# Patient Record
Sex: Female | Born: 1945
Health system: Southern US, Community
[De-identification: ages and names within clinical notes are randomized; demographics above are authoritative.]

## PROBLEM LIST (undated history)

## (undated) DIAGNOSIS — J449 Chronic obstructive pulmonary disease, unspecified: Secondary | ICD-10-CM

## (undated) DIAGNOSIS — M1990 Unspecified osteoarthritis, unspecified site: Secondary | ICD-10-CM

## (undated) DIAGNOSIS — K219 Gastro-esophageal reflux disease without esophagitis: Secondary | ICD-10-CM

## (undated) DIAGNOSIS — R1310 Dysphagia, unspecified: Secondary | ICD-10-CM

## (undated) HISTORY — PX: RECTOPERITONEAL FISTULA CLOSURE: SHX2314

## (undated) HISTORY — PX: UPPER GASTROINTESTINAL ENDOSCOPY: SHX188

## (undated) HISTORY — DX: Dysphagia, unspecified: R13.10

---

## 1967-04-22 HISTORY — PX: RECONSTRUCTION MID-FACE: SUR1085

## 1998-08-13 ENCOUNTER — Other Ambulatory Visit: Admission: RE | Admit: 1998-08-13 | Discharge: 1998-08-13 | Payer: Self-pay | Admitting: Gynecology

## 1999-08-15 ENCOUNTER — Other Ambulatory Visit: Admission: RE | Admit: 1999-08-15 | Discharge: 1999-08-15 | Payer: Self-pay | Admitting: Gynecology

## 2000-08-18 ENCOUNTER — Other Ambulatory Visit: Admission: RE | Admit: 2000-08-18 | Discharge: 2000-08-18 | Payer: Self-pay | Admitting: Gynecology

## 2001-08-19 ENCOUNTER — Other Ambulatory Visit: Admission: RE | Admit: 2001-08-19 | Discharge: 2001-08-19 | Payer: Self-pay | Admitting: Gynecology

## 2002-09-01 ENCOUNTER — Other Ambulatory Visit: Admission: RE | Admit: 2002-09-01 | Discharge: 2002-09-01 | Payer: Self-pay | Admitting: Gynecology

## 2003-10-09 ENCOUNTER — Other Ambulatory Visit: Admission: RE | Admit: 2003-10-09 | Discharge: 2003-10-09 | Payer: Self-pay | Admitting: Gynecology

## 2003-10-30 ENCOUNTER — Ambulatory Visit (HOSPITAL_COMMUNITY): Admission: RE | Admit: 2003-10-30 | Discharge: 2003-10-30 | Payer: Self-pay | Admitting: Internal Medicine

## 2004-10-11 ENCOUNTER — Other Ambulatory Visit: Admission: RE | Admit: 2004-10-11 | Discharge: 2004-10-11 | Payer: Self-pay | Admitting: Gynecology

## 2005-05-20 ENCOUNTER — Encounter: Admission: RE | Admit: 2005-05-20 | Discharge: 2005-05-20 | Payer: Self-pay | Admitting: Neurosurgery

## 2005-06-03 ENCOUNTER — Encounter: Admission: RE | Admit: 2005-06-03 | Discharge: 2005-06-03 | Payer: Self-pay | Admitting: Neurosurgery

## 2005-06-18 ENCOUNTER — Encounter: Admission: RE | Admit: 2005-06-18 | Discharge: 2005-06-18 | Payer: Self-pay | Admitting: Neurosurgery

## 2010-05-12 ENCOUNTER — Encounter: Payer: Self-pay | Admitting: Internal Medicine

## 2011-03-27 ENCOUNTER — Ambulatory Visit (INDEPENDENT_AMBULATORY_CARE_PROVIDER_SITE_OTHER): Payer: Medicare Other | Admitting: Otolaryngology

## 2011-03-27 DIAGNOSIS — K121 Other forms of stomatitis: Secondary | ICD-10-CM

## 2011-05-11 ENCOUNTER — Encounter (HOSPITAL_COMMUNITY): Payer: Self-pay | Admitting: *Deleted

## 2011-05-11 ENCOUNTER — Emergency Department (HOSPITAL_COMMUNITY)
Admission: EM | Admit: 2011-05-11 | Discharge: 2011-05-11 | Disposition: A | Discharge status: Home / Self Care | Payer: Medicare Other | Attending: Emergency Medicine | Admitting: Emergency Medicine

## 2011-05-11 ENCOUNTER — Emergency Department (HOSPITAL_COMMUNITY): Payer: Medicare Other

## 2011-05-11 DIAGNOSIS — J40 Bronchitis, not specified as acute or chronic: Secondary | ICD-10-CM | POA: Insufficient documentation

## 2011-05-11 DIAGNOSIS — R059 Cough, unspecified: Secondary | ICD-10-CM | POA: Insufficient documentation

## 2011-05-11 DIAGNOSIS — F17200 Nicotine dependence, unspecified, uncomplicated: Secondary | ICD-10-CM | POA: Insufficient documentation

## 2011-05-11 DIAGNOSIS — J3489 Other specified disorders of nose and nasal sinuses: Secondary | ICD-10-CM | POA: Insufficient documentation

## 2011-05-11 DIAGNOSIS — Z7982 Long term (current) use of aspirin: Secondary | ICD-10-CM | POA: Insufficient documentation

## 2011-05-11 DIAGNOSIS — R05 Cough: Secondary | ICD-10-CM | POA: Insufficient documentation

## 2011-05-11 DIAGNOSIS — R109 Unspecified abdominal pain: Secondary | ICD-10-CM | POA: Insufficient documentation

## 2011-05-11 DIAGNOSIS — R042 Hemoptysis: Secondary | ICD-10-CM | POA: Insufficient documentation

## 2011-05-11 LAB — COMPREHENSIVE METABOLIC PANEL
ALT: 18 U/L (ref 0–35)
AST: 18 U/L (ref 0–37)
Alkaline Phosphatase: 83 U/L (ref 39–117)
Calcium: 10.3 mg/dL (ref 8.4–10.5)
Chloride: 105 mEq/L (ref 96–112)
Creatinine, Ser: 0.75 mg/dL (ref 0.50–1.10)
Potassium: 3.9 mEq/L (ref 3.5–5.1)
Sodium: 140 mEq/L (ref 135–145)
Total Bilirubin: 0.6 mg/dL (ref 0.3–1.2)

## 2011-05-11 LAB — CBC
MCH: 32.7 pg (ref 26.0–34.0)
MCHC: 35.2 g/dL (ref 30.0–36.0)
RBC: 4.31 MIL/uL (ref 3.87–5.11)
RDW: 12.2 % (ref 11.5–15.5)
WBC: 6.4 10*3/uL (ref 4.0–10.5)

## 2011-05-11 LAB — PROTIME-INR: Prothrombin Time: 12.7 seconds (ref 11.6–15.2)

## 2011-05-11 MED ORDER — MOXIFLOXACIN HCL 400 MG PO TABS
400.0000 mg | ORAL_TABLET | Freq: Every day | ORAL | Status: DC
  Administered 2011-05-11: 400 mg via ORAL
  Filled 2011-05-11: qty 1

## 2011-05-11 MED ORDER — ALBUTEROL SULFATE HFA 108 (90 BASE) MCG/ACT IN AERS
2.0000 | INHALATION_SPRAY | RESPIRATORY_TRACT | Status: DC | PRN
  Administered 2011-05-11: 2 via RESPIRATORY_TRACT
  Filled 2011-05-11: qty 6.7

## 2011-05-11 MED ORDER — MOXIFLOXACIN HCL 400 MG PO TABS: 400.0000 mg | ORAL_TABLET | Freq: Every day | ORAL | Status: AC

## 2011-05-11 NOTE — ED Provider Notes (Signed)
History     CSN: [id number]  Arrival date & time 05/11/11  0501   First MD Initiated Contact with Patient 05/11/11 [id number]      Chief Complaint  Patient presents with  . Hemoptysis  . Nasal Congestion  . Abdominal Pain    (Consider location/radiation/quality/duration/timing/severity/associated sxs/prior treatment) HPI Patient presents with his complaint of cough and coughing up blood. She states that she has had a cough productive of clear sputum for the past 3 weeks. She's also had nasal congestion. She states that last night she continued to have coughing but it was productive of blood. She estimates that the amount of blood was less than 1/2 cup. There's been no clots of blood. She has had no dizziness weakness or fainting. She has no history of easy bruising or bleeding. There's been no vomiting although she does have a history of reflux. She denies any difficulty breathing or chest pain. She had not had any treatment for the cough. She has not taken any medications for her symptoms prior to arrival. There no alleviating or modifying factors. There no associated systemic symptoms.  History reviewed. No pertinent past medical history.  History reviewed. No pertinent past surgical history.  History reviewed. No pertinent family history.  History  Substance Use Topics  . Smoking status: Current Everyday Smoker  . Smokeless tobacco: Not on file  . Alcohol Use: No    OB History    Grav Para Term Preterm Abortions TAB SAB Ect Mult Living                  Review of Systems ROS reviewed and otherwise negative except for mentioned in HPI  Allergies  Review of patient's allergies indicates no known allergies.  Home Medications   Current Outpatient Rx  Name Route Sig Dispense Refill  . ASPIRIN 81 MG PO TABS Oral Take 160 mg by mouth daily.    . OMEGA-3 FATTY ACIDS 1000 MG PO CAPS Oral Take 2 g by mouth daily.    . CENTRUM SILVER PO Oral Take by mouth.    Marland Kitchen MOXIFLOXACIN HCL  400 MG PO TABS Oral Take 1 tablet (400 mg total) by mouth daily. 10 tablet 0    BP 127/58  Pulse 74  Temp(Src) 98.6 F (37 C) (Oral)  Resp 20  Ht 5\' 4"  (1.626 m)  Wt 130 lb (58.968 kg)  BMI 22.31 kg/m2  SpO2 98% Vitals reviewed Physical Exam Physical Examination: General appearance - alert, well appearing, and in no distress Mental status - alert, oriented to person, place, and time Mouth - mucous membranes moist, pharynx normal without lesions Chest - clear to auscultation, occasional rhonchi clearing with cough, no wheezes, rales, symmetric air entry Heart - normal rate, regular rhythm, normal S1, S2, no murmurs, rubs, clicks or gallops Abdomen - soft, nontender, nondistended, no masses or organomegaly Musculoskeletal - no joint tenderness, deformity or swelling Extremities - peripheral pulses normal, no pedal edema, no clubbing or cyanosis Skin - normal coloration and turgor, no rashes  ED Course  Procedures (including critical care time)  Labs Reviewed  COMPREHENSIVE METABOLIC PANEL - Abnormal; Notable for the following:    Glucose, Bld 101 (*)    GFR calc non Af Amer 87 (*)    All other components within normal limits  CBC  PROTIME-INR  APTT   Dg Chest 2 View  05/11/2011  *RADIOLOGY REPORT*  Clinical Data: Cough.  CHEST - 2 VIEW  Comparison: 10/30/2003  Findings: There is  hyperinflation of the lungs compatible with COPD.  Heart and mediastinal contours are within normal limits.  No focal opacities or effusions.  No acute bony abnormality.  IMPRESSION: COPD.  No active disease.  Original Report Authenticated By: Cyndie Chime, M.D.   Ct Chest Wo Contrast  05/11/2011  *RADIOLOGY REPORT*  Clinical Data: Congestion, hemoptysis  CT CHEST WITHOUT CONTRAST  Technique:  Multidetector CT imaging of the chest was performed following the standard protocol without IV contrast.  Comparison: Chest radiographs dated 05/11/2011.  Findings: Moderate centrilobular emphysematous changes.   Biapical pleural parenchymal scarring.  No suspicious pulmonary nodules.  No pleural effusion or pneumothorax.  Visualized thyroid is mildly heterogeneous/nodular.  The heart is normal in size.  No pericardial effusion.  Coronary atherosclerosis.  Mild atherosclerotic calcifications of the aortic arch.  No suspicious mediastinal or axillary lymphadenopathy.  Visualized upper abdomen is notable for vascular calcifications and tiny hepatic cysts.  Degenerative changes of the visualized thoracolumbar spine.  IMPRESSION: No evidence of acute cardiopulmonary disease.  Moderate centrilobular emphysematous changes.  Original Report Authenticated By: Charline Bills, M.D.     1. Bronchitis       MDM  Patient with chronic cough over the past 3 weeks is presenting with coughing up blood beginning last night. Upon presentation to the ED she is nontoxic and well-appearing with stable vital signs. Her workup including labs chest x-ray and CT scan of her chest have no acute findings other than COPD. Patient is not aware of a history of COPD but does smoke cigarettes. There is no active bleeding or acute abnormality on CT scan. Patient will be started on albuterol and moxifloxacin for bronchitis. Results were discussed with her and she will arrange for close followup with her primary doctor at [facility]. She was given strict return precautions and is agreeable with this plan.        Ethelda Chick, MD 05/11/11 [phone]

## 2011-05-11 NOTE — ED Notes (Signed)
Pt return from xray.

## 2011-05-11 NOTE — ED Notes (Addendum)
States she also "eats a lot of TUMS" for indigestion.  Denies any black or tarry stools. States she had a bad sinus infection that began three weeks ago, has used Claritin, has been [place] acetominephen and one  ASA a day. Skin warm and dry.  Color wnl.  Denies nose bleed

## 2011-05-11 NOTE — ED Notes (Signed)
Coughs and then spits out bright red blood

## 2011-05-11 NOTE — ED Notes (Addendum)
Labs drawn when IV started.  Patient did also state she had experienced some weakness tonight.  Awaiting CT scan.

## 2011-05-11 NOTE — ED Notes (Signed)
Pt c/o congestion x 3wks. Pt states she started spitting up bright red blood since 9:30pm last night. Pt also c/o acid reflux.

## 2011-05-11 NOTE — ED Notes (Signed)
Continues to cough and spit out bright red blood

## 2011-05-12 ENCOUNTER — Other Ambulatory Visit (HOSPITAL_COMMUNITY): Payer: Self-pay | Admitting: Family Medicine

## 2011-05-12 DIAGNOSIS — Z139 Encounter for screening, unspecified: Secondary | ICD-10-CM

## 2011-05-15 ENCOUNTER — Ambulatory Visit (HOSPITAL_COMMUNITY)
Admission: RE | Admit: 2011-05-15 | Discharge: 2011-05-15 | Disposition: A | Discharge status: Home / Self Care | Payer: Medicare Other | Source: Ambulatory Visit | Attending: Family Medicine | Admitting: Family Medicine

## 2011-05-15 DIAGNOSIS — Z139 Encounter for screening, unspecified: Secondary | ICD-10-CM

## 2011-05-15 DIAGNOSIS — Z13820 Encounter for screening for osteoporosis: Secondary | ICD-10-CM | POA: Insufficient documentation

## 2011-05-15 DIAGNOSIS — M949 Disorder of cartilage, unspecified: Secondary | ICD-10-CM | POA: Insufficient documentation

## 2011-05-15 DIAGNOSIS — M899 Disorder of bone, unspecified: Secondary | ICD-10-CM | POA: Insufficient documentation

## 2011-05-15 DIAGNOSIS — Z780 Asymptomatic menopausal state: Secondary | ICD-10-CM | POA: Insufficient documentation

## 2011-05-29 ENCOUNTER — Ambulatory Visit (INDEPENDENT_AMBULATORY_CARE_PROVIDER_SITE_OTHER): Payer: Medicare Other | Admitting: Otolaryngology

## 2011-05-29 DIAGNOSIS — R070 Pain in throat: Secondary | ICD-10-CM

## 2011-05-29 DIAGNOSIS — K219 Gastro-esophageal reflux disease without esophagitis: Secondary | ICD-10-CM

## 2011-06-26 ENCOUNTER — Ambulatory Visit (INDEPENDENT_AMBULATORY_CARE_PROVIDER_SITE_OTHER): Payer: Medicare Other | Admitting: Otolaryngology

## 2011-06-26 DIAGNOSIS — J0100 Acute maxillary sinusitis, unspecified: Secondary | ICD-10-CM

## 2011-06-26 DIAGNOSIS — K219 Gastro-esophageal reflux disease without esophagitis: Secondary | ICD-10-CM

## 2011-06-26 DIAGNOSIS — R070 Pain in throat: Secondary | ICD-10-CM

## 2011-06-26 DIAGNOSIS — J0110 Acute frontal sinusitis, unspecified: Secondary | ICD-10-CM

## 2011-08-07 ENCOUNTER — Ambulatory Visit (INDEPENDENT_AMBULATORY_CARE_PROVIDER_SITE_OTHER): Payer: Medicare Other | Admitting: Otolaryngology

## 2011-08-07 DIAGNOSIS — J0100 Acute maxillary sinusitis, unspecified: Secondary | ICD-10-CM

## 2011-08-07 DIAGNOSIS — J330 Polyp of nasal cavity: Secondary | ICD-10-CM

## 2011-08-28 ENCOUNTER — Ambulatory Visit (INDEPENDENT_AMBULATORY_CARE_PROVIDER_SITE_OTHER): Payer: Medicare Other | Admitting: Otolaryngology

## 2011-09-24 ENCOUNTER — Encounter: Payer: Self-pay | Admitting: Gastroenterology

## 2011-10-02 ENCOUNTER — Encounter (HOSPITAL_COMMUNITY): Payer: Self-pay | Admitting: *Deleted

## 2011-10-02 ENCOUNTER — Other Ambulatory Visit (INDEPENDENT_AMBULATORY_CARE_PROVIDER_SITE_OTHER): Payer: Self-pay | Admitting: *Deleted

## 2011-10-02 ENCOUNTER — Encounter (HOSPITAL_COMMUNITY)
Admission: RE | Disposition: A | Discharge status: Home / Self Care | Payer: Self-pay | Source: Ambulatory Visit | Attending: Internal Medicine

## 2011-10-02 ENCOUNTER — Ambulatory Visit (HOSPITAL_COMMUNITY)
Admission: RE | Admit: 2011-10-02 | Discharge: 2011-10-02 | Disposition: A | Discharge status: Home / Self Care | Payer: Medicare Other | Source: Ambulatory Visit | Attending: Internal Medicine | Admitting: Internal Medicine

## 2011-10-02 DIAGNOSIS — K222 Esophageal obstruction: Secondary | ICD-10-CM | POA: Insufficient documentation

## 2011-10-02 DIAGNOSIS — K228 Other specified diseases of esophagus: Secondary | ICD-10-CM

## 2011-10-02 DIAGNOSIS — R1310 Dysphagia, unspecified: Secondary | ICD-10-CM | POA: Insufficient documentation

## 2011-10-02 DIAGNOSIS — K2289 Other specified disease of esophagus: Secondary | ICD-10-CM

## 2011-10-02 DIAGNOSIS — K449 Diaphragmatic hernia without obstruction or gangrene: Secondary | ICD-10-CM | POA: Insufficient documentation

## 2011-10-02 DIAGNOSIS — Z7982 Long term (current) use of aspirin: Secondary | ICD-10-CM | POA: Insufficient documentation

## 2011-10-02 HISTORY — DX: Unspecified osteoarthritis, unspecified site: M19.90

## 2011-10-02 HISTORY — DX: Gastro-esophageal reflux disease without esophagitis: K21.9

## 2011-10-02 SURGERY — ESOPHAGOGASTRODUODENOSCOPY (EGD) WITH ESOPHAGEAL DILATION
Anesthesia: Moderate Sedation

## 2011-10-02 MED ORDER — PANTOPRAZOLE SODIUM 40 MG PO TBEC: 40.0000 mg | DELAYED_RELEASE_TABLET | Freq: Every day | ORAL | Status: DC

## 2011-10-02 MED ORDER — SODIUM CHLORIDE 0.45 % IV SOLN
Freq: Once | INTRAVENOUS | Status: AC
  Administered 2011-10-02: 12:00:00 via INTRAVENOUS

## 2011-10-02 MED ORDER — BUTAMBEN-TETRACAINE-BENZOCAINE 2-2-14 % EX AERO
INHALATION_SPRAY | CUTANEOUS | Status: DC | PRN
  Administered 2011-10-02: 1 via TOPICAL

## 2011-10-02 MED ORDER — MEPERIDINE HCL 50 MG/ML IJ SOLN
INTRAMUSCULAR | Status: AC
  Filled 2011-10-02: qty 1

## 2011-10-02 MED ORDER — MEPERIDINE HCL 25 MG/ML IJ SOLN
INTRAMUSCULAR | Status: DC | PRN
  Administered 2011-10-02 (×2): 25 mg via INTRAVENOUS

## 2011-10-02 MED ORDER — MIDAZOLAM HCL 5 MG/5ML IJ SOLN
INTRAMUSCULAR | Status: DC | PRN
  Administered 2011-10-02 (×2): 2 mg via INTRAVENOUS
  Administered 2011-10-02 (×2): 1 mg via INTRAVENOUS

## 2011-10-02 MED ORDER — MIDAZOLAM HCL 5 MG/5ML IJ SOLN
INTRAMUSCULAR | Status: AC
  Filled 2011-10-02: qty 10
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
## 2011-10-02 NOTE — Discharge Instructions (Signed)
Resume usual medications. Pantoprazole 40 mg by mouth daily 30 minutes before breakfast. No driving for 24 hours. Physician will contact you with biopsy results

## 2011-10-02 NOTE — Op Note (Signed)
EGD PROCEDURE REPORT  PATIENT:  Abigail Evans  MR#:  [id number] Birthdate:  04-29-1945, 66 y.o., female Endoscopist:  Dr. Malissa Hippo, MD Referred By:  Dr. Madelin Rear. Sherwood Gambler, MD Procedure Date: 10/02/2011  Procedure:   EGD with ED.  Indications:  Patient is 66 year old Caucasian female who presents for solid food dysphagia. She feels she may have a food bolus stuck in her esophagus. She is undergoing diagnostic/therapeutic EGD. She does not take NSAIDs on regular basis and denies chronic heartburn.            Informed Consent:  The risks, benefits, alternatives & imponderables which include, but are not limited to, bleeding, infection, perforation, drug reaction and potential missed lesion have been reviewed.  The potential for biopsy, lesion removal, esophageal dilation, etc. have also been discussed.  Questions have been answered.  All parties agreeable.  Please see history & physical in medical record for more information.  Medications:  Demerol 50 mg IV Versed 6 mg IV Cetacaine spray topically for oropharyngeal anesthesia  Description of procedure:  The endoscope was introduced through the mouth and advanced to the second portion of the duodenum without difficulty or limitations. The mucosal surfaces were surveyed very carefully during advancement of the scope and upon withdrawal.  Findings:  Esophagus:  Esophageal mucosa at body revealed coarse appearance with linear furrows along with soft stricture at GE junction. GEJ:  37 cm Hiatus:  39 cm Stomach:  Stomach was empty and distended very well with insufflation. Folds in the proximal stomach were normal. Examination mucosa at body, antrum, pyloric channel, angularis, fundus and cardia was normal. Duodenum:  Normal bulbar and post bulbar  Therapeutic/Diagnostic Maneuvers Performed:  Stricture at GE junction was dilated with a balloon from 15-16.5 and 18 mm disrupting the mucosa. Esophageal biopsy was taken from body again for  eosinophilic esophagitis.  Complications:  None  Impression: No evidence of esophageal foreign body. Soft stricture at GE junction which was dilated with a balloon to 18 mm. Esophageal mucosal appearance suggestive of eosinophilic esophagitis. It was biopsied for histology. Small sliding hiatal hernia.  Recommendations:  Anti-reflux measures. Pantoprazole 40 mg by mouth every morning. I would be contacting patient with biopsy results and further recommendations.  Adrena Nakamura U  10/02/2011  1:16 PM  CC: Dr. Colette Ribas, MD & Dr. Bonnetta Barry ref. provider found

## 2011-10-09 ENCOUNTER — Encounter (INDEPENDENT_AMBULATORY_CARE_PROVIDER_SITE_OTHER): Payer: Self-pay | Admitting: *Deleted

## 2011-10-21 ENCOUNTER — Ambulatory Visit: Payer: Medicare Other | Admitting: Gastroenterology

## 2011-12-02 ENCOUNTER — Encounter (INDEPENDENT_AMBULATORY_CARE_PROVIDER_SITE_OTHER): Payer: Self-pay | Admitting: Internal Medicine

## 2011-12-02 ENCOUNTER — Ambulatory Visit (INDEPENDENT_AMBULATORY_CARE_PROVIDER_SITE_OTHER): Payer: Medicare Other | Admitting: Internal Medicine

## 2011-12-02 VITALS — BP 110/70 | HR 72 | Temp 97.6°F | Resp 20 | Ht 65.0 in | Wt 132.1 lb

## 2011-12-02 DIAGNOSIS — K222 Esophageal obstruction: Secondary | ICD-10-CM

## 2011-12-02 DIAGNOSIS — K219 Gastro-esophageal reflux disease without esophagitis: Secondary | ICD-10-CM

## 2011-12-02 NOTE — Patient Instructions (Addendum)
Call if you have swallowing difficulty. 

## 2011-12-02 NOTE — Progress Notes (Signed)
Presenting complaint;  Followup for GERD and dysphagia.  Subjective:  Patient is 66 year old Caucasian female who has chronic GERD who presented for emergency EGD 2 months ago. She possibly passed food bolus spontaneously. She was found to have soft stricture at GE junction which was dilated to 18 mm with a balloon. She's been maintained on pantoprazole. She feels 100% better. She says Prilosec did not work. She is not having any side effects with pantoprazole. She denies abdominal pain melena or rectal bleeding. She is interested in having colonoscopy for screening purposes later this year.  Current Medications: Current Outpatient Prescriptions  Medication Sig Dispense Refill  . aspirin 81 MG tablet Take 81 mg by mouth daily.       . Calcium Citrate-Vitamin D (CITRACAL/VITAMIN D PO) Take by mouth daily.      . pantoprazole (PROTONIX) 40 MG tablet Take 1 tablet (40 mg total) by mouth daily.  30 tablet  5     Objective: Blood pressure 110/70, pulse 72, temperature 97.6 F (36.4 C), temperature source Oral, resp. rate 20, height 5\' 5"  (1.651 m), weight 132 lb 1.6 oz (59.92 kg).  Conjunctiva is pink. Sclera is nonicteric Oropharyngeal mucosa is normal. No neck masses or thyromegaly noted. Cardiac exam with regular rhythm normal S1 and S2. No murmur or gallop noted. Lungs are clear to auscultation. Abdomen is soft and nontender without organomegaly or masses.  No LE edema or clubbing noted.  Labs/studies Results: Esophageal biopsy was negative for eosinophilic esophagitis.  Assessment:  Chronic GERD complicated by distal esophageal stricture. She is status post balloon dilation two months ago with complete resolution of her dysphagia. Esophageal biopsy was negative for by eosinophilic esophagitis.   Plan:  Continue anti-reflux measures and pantoprazole at current dose. Patient will call to schedule screening colonoscopy when she is ready. Office visit in one year.

## 2012-04-05 ENCOUNTER — Other Ambulatory Visit (HOSPITAL_COMMUNITY): Payer: Self-pay | Admitting: Internal Medicine

## 2012-04-05 DIAGNOSIS — Z0000 Encounter for general adult medical examination without abnormal findings: Secondary | ICD-10-CM

## 2012-04-08 ENCOUNTER — Ambulatory Visit (HOSPITAL_COMMUNITY): Payer: Medicare Other

## 2012-04-09 ENCOUNTER — Ambulatory Visit (HOSPITAL_COMMUNITY): Payer: Medicare Other

## 2012-04-15 ENCOUNTER — Ambulatory Visit (HOSPITAL_COMMUNITY)
Admission: RE | Admit: 2012-04-15 | Discharge: 2012-04-15 | Disposition: A | Discharge status: Home / Self Care | Payer: Medicare Other | Source: Ambulatory Visit | Attending: Internal Medicine | Admitting: Internal Medicine

## 2012-04-15 DIAGNOSIS — Z0000 Encounter for general adult medical examination without abnormal findings: Secondary | ICD-10-CM

## 2012-04-15 DIAGNOSIS — Z1231 Encounter for screening mammogram for malignant neoplasm of breast: Secondary | ICD-10-CM | POA: Insufficient documentation

## 2012-12-17 ENCOUNTER — Encounter (INDEPENDENT_AMBULATORY_CARE_PROVIDER_SITE_OTHER): Payer: Self-pay | Admitting: *Deleted

## 2013-02-08 ENCOUNTER — Encounter (INDEPENDENT_AMBULATORY_CARE_PROVIDER_SITE_OTHER): Payer: Self-pay | Admitting: Internal Medicine

## 2013-02-08 ENCOUNTER — Ambulatory Visit (INDEPENDENT_AMBULATORY_CARE_PROVIDER_SITE_OTHER): Payer: Medicare Other | Admitting: Internal Medicine

## 2013-02-08 VITALS — BP 124/68 | HR 74 | Temp 98.2°F | Resp 18 | Ht 65.0 in | Wt 130.6 lb

## 2013-02-08 DIAGNOSIS — K219 Gastro-esophageal reflux disease without esophagitis: Secondary | ICD-10-CM

## 2013-02-08 MED ORDER — PANTOPRAZOLE SODIUM 40 MG PO TBEC: 40.0000 mg | DELAYED_RELEASE_TABLET | Freq: Every day | ORAL | Status: DC

## 2013-02-08 NOTE — Progress Notes (Signed)
Presenting complaint;  Followup for chronic GERD.  Subjective:  Patient is a 67 year old Caucasian female who is here for yearly visit. She underwent EGD in June 2013 for dysphagia. She was found to have distal esophageal stricture which was dilated to 18 mm. Biopsy from esophagus was negative for eosinophilic esophagitis. She denies heartburn or swallowing difficulty. She complains of feeling of liquid in her throat and having to clear off and in this symptom is more pronounced at night. She has heartburn with certain foods. She may have some difficulty with bread and none with meat. She remains with good appetite. She denies abdominal pain melena or rectal bleeding. She is taking pantoprazole on as-needed basis only  Current Medications: Current Outpatient Prescriptions  Medication Sig Dispense Refill  . aspirin 81 MG tablet Take 81 mg by mouth daily.       . Calcium Citrate-Vitamin D (CITRACAL/VITAMIN D PO) Take by mouth daily.      . fish oil-omega-3 fatty acids 1000 MG capsule Take 1 g by mouth daily.      . pantoprazole (PROTONIX) 40 MG tablet Take 40 mg by mouth daily.      . pantoprazole (PROTONIX) 40 MG tablet Take 1 tablet (40 mg total) by mouth daily.  30 tablet  5   No current facility-administered medications for this visit.     Objective: Blood pressure 124/68, pulse 74, temperature 98.2 F (36.8 C), temperature source Oral, resp. rate 18, height 5\' 5"  (1.651 m), weight 130 lb 9.6 oz (59.24 kg). Conjunctiva is pink. Sclera is nonicteric Oropharyngeal mucosa is normal. No neck masses or thyromegaly noted. Cardiac exam with regular rhythm normal S1 and S2. No murmur or gallop noted. Lungs are clear to auscultation. Abdomen is flat and soft without tenderness organomegaly or masses. No LE edema or clubbing noted.    Assessment:  #1. Patient's throat symptoms appear to be secondary to GERD. She is taking PPI on when necessary basis which is not sufficient. She has  history of distal esophageal stricture and needs to be on PPI regularly.   Plan:  Patient advised to take pantoprazole 40 mg by mouth every morning. New prescription given for 90 days with 3 refills. She will call with progress report in one month and return for office visit in one year.

## 2013-02-08 NOTE — Patient Instructions (Signed)
Take pantoprazole 40 mg by mouth 30 minutes before breakfast daily. Please call office with progress report in one month.

## 2013-12-05 ENCOUNTER — Other Ambulatory Visit (HOSPITAL_COMMUNITY): Payer: Self-pay | Admitting: Gynecology

## 2013-12-05 DIAGNOSIS — Z139 Encounter for screening, unspecified: Secondary | ICD-10-CM

## 2013-12-08 ENCOUNTER — Ambulatory Visit (HOSPITAL_COMMUNITY)
Admission: RE | Admit: 2013-12-08 | Discharge: 2013-12-08 | Disposition: A | Discharge status: Home / Self Care | Payer: Medicare Other | Source: Ambulatory Visit | Attending: Gynecology | Admitting: Gynecology

## 2013-12-08 DIAGNOSIS — Z1231 Encounter for screening mammogram for malignant neoplasm of breast: Secondary | ICD-10-CM | POA: Insufficient documentation

## 2013-12-08 DIAGNOSIS — Z139 Encounter for screening, unspecified: Secondary | ICD-10-CM

## 2014-03-09 ENCOUNTER — Encounter (INDEPENDENT_AMBULATORY_CARE_PROVIDER_SITE_OTHER): Payer: Self-pay | Admitting: *Deleted

## 2014-04-11 ENCOUNTER — Encounter (INDEPENDENT_AMBULATORY_CARE_PROVIDER_SITE_OTHER): Payer: Self-pay | Admitting: Internal Medicine

## 2014-04-11 ENCOUNTER — Ambulatory Visit (INDEPENDENT_AMBULATORY_CARE_PROVIDER_SITE_OTHER): Payer: Medicare Other | Admitting: Internal Medicine

## 2014-04-11 VITALS — BP 116/52 | HR 64 | Temp 97.8°F | Ht 65.0 in | Wt 131.7 lb

## 2014-04-11 DIAGNOSIS — R1314 Dysphagia, pharyngoesophageal phase: Secondary | ICD-10-CM

## 2014-04-11 DIAGNOSIS — K219 Gastro-esophageal reflux disease without esophagitis: Secondary | ICD-10-CM
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
## 2014-04-11 NOTE — Patient Instructions (Signed)
OV in 1 year.  

## 2015-01-08 ENCOUNTER — Encounter (INDEPENDENT_AMBULATORY_CARE_PROVIDER_SITE_OTHER): Payer: Self-pay | Admitting: *Deleted

## 2015-01-31 ENCOUNTER — Other Ambulatory Visit (HOSPITAL_COMMUNITY): Payer: Self-pay | Admitting: Internal Medicine

## 2015-01-31 DIAGNOSIS — Z1231 Encounter for screening mammogram for malignant neoplasm of breast: Secondary | ICD-10-CM

## 2015-02-08 ENCOUNTER — Ambulatory Visit (HOSPITAL_COMMUNITY)
Admission: RE | Admit: 2015-02-08 | Discharge: 2015-02-08 | Disposition: A | Discharge status: Home / Self Care | Payer: Medicare Other | Source: Ambulatory Visit | Attending: Internal Medicine | Admitting: Internal Medicine

## 2015-02-08 DIAGNOSIS — Z1231 Encounter for screening mammogram for malignant neoplasm of breast: Secondary | ICD-10-CM | POA: Diagnosis not present

## 2015-02-09 ENCOUNTER — Other Ambulatory Visit (HOSPITAL_COMMUNITY): Payer: Self-pay | Admitting: Internal Medicine

## 2015-02-09 ENCOUNTER — Ambulatory Visit (HOSPITAL_COMMUNITY)
Admission: RE | Admit: 2015-02-09 | Discharge: 2015-02-09 | Disposition: A | Discharge status: Home / Self Care | Payer: Medicare Other | Source: Ambulatory Visit | Attending: Internal Medicine | Admitting: Internal Medicine

## 2015-02-09 DIAGNOSIS — R05 Cough: Secondary | ICD-10-CM

## 2015-02-09 DIAGNOSIS — J439 Emphysema, unspecified: Secondary | ICD-10-CM | POA: Diagnosis not present

## 2015-02-09 DIAGNOSIS — R059 Cough, unspecified: Secondary | ICD-10-CM

## 2015-02-09 DIAGNOSIS — J449 Chronic obstructive pulmonary disease, unspecified: Secondary | ICD-10-CM | POA: Diagnosis not present

## 2015-04-17 ENCOUNTER — Ambulatory Visit (INDEPENDENT_AMBULATORY_CARE_PROVIDER_SITE_OTHER): Payer: Medicare Other | Admitting: Internal Medicine

## 2016-08-17 ENCOUNTER — Emergency Department (HOSPITAL_COMMUNITY): Payer: PPO

## 2016-08-17 ENCOUNTER — Inpatient Hospital Stay (HOSPITAL_COMMUNITY)
Admission: EM | Admit: 2016-08-17 | Discharge: 2016-08-19 | DRG: 194 | Disposition: A | Discharge status: Home / Self Care | Payer: PPO | Attending: Internal Medicine | Admitting: Internal Medicine

## 2016-08-17 ENCOUNTER — Encounter (HOSPITAL_COMMUNITY): Payer: Self-pay | Admitting: Emergency Medicine

## 2016-08-17 DIAGNOSIS — K219 Gastro-esophageal reflux disease without esophagitis: Secondary | ICD-10-CM | POA: Diagnosis present

## 2016-08-17 DIAGNOSIS — Z801 Family history of malignant neoplasm of trachea, bronchus and lung: Secondary | ICD-10-CM

## 2016-08-17 DIAGNOSIS — Z8249 Family history of ischemic heart disease and other diseases of the circulatory system: Secondary | ICD-10-CM | POA: Diagnosis not present

## 2016-08-17 DIAGNOSIS — F17210 Nicotine dependence, cigarettes, uncomplicated: Secondary | ICD-10-CM | POA: Diagnosis present

## 2016-08-17 DIAGNOSIS — J449 Chronic obstructive pulmonary disease, unspecified: Secondary | ICD-10-CM | POA: Diagnosis not present

## 2016-08-17 DIAGNOSIS — R042 Hemoptysis: Secondary | ICD-10-CM | POA: Diagnosis not present

## 2016-08-17 DIAGNOSIS — J189 Pneumonia, unspecified organism: Secondary | ICD-10-CM

## 2016-08-17 DIAGNOSIS — Z7982 Long term (current) use of aspirin: Secondary | ICD-10-CM

## 2016-08-17 DIAGNOSIS — J440 Chronic obstructive pulmonary disease with acute lower respiratory infection: Secondary | ICD-10-CM | POA: Diagnosis present

## 2016-08-17 DIAGNOSIS — J181 Lobar pneumonia, unspecified organism: Secondary | ICD-10-CM

## 2016-08-17 LAB — BASIC METABOLIC PANEL
ANION GAP: 9 (ref 5–15)
BUN: 12 mg/dL (ref 6–20)
CALCIUM: 9.5 mg/dL (ref 8.9–10.3)
CO2: 24 mmol/L (ref 22–32)
Chloride: 105 mmol/L (ref 101–111)
Creatinine, Ser: 0.95 mg/dL (ref 0.44–1.00)
GFR, EST NON AFRICAN AMERICAN: 59 mL/min — AB (ref >60)
Glucose, Bld: 110 mg/dL — ABNORMAL HIGH (ref 65–99)
Potassium: 3.7 mmol/L (ref 3.5–5.1)
SODIUM: 138 mmol/L (ref 135–145)

## 2016-08-17 LAB — TYPE AND SCREEN
ABO/RH(D): O POS
ANTIBODY SCREEN: NEGATIVE

## 2016-08-17 LAB — CBC WITH DIFFERENTIAL/PLATELET
BASOS ABS: 0 10*3/uL (ref 0.0–0.1)
BASOS PCT: 0 %
EOS ABS: 0.2 10*3/uL (ref 0.0–0.7)
Eosinophils Relative: 2 %
HCT: 39.3 % (ref 36.0–46.0)
Hemoglobin: 13.7 g/dL (ref 12.0–15.0)
Lymphocytes Relative: 31 %
Lymphs Abs: 2 10*3/uL (ref 0.7–4.0)
MCH: 32.8 pg (ref 26.0–34.0)
MCHC: 34.9 g/dL (ref 30.0–36.0)
MCV: 94 fL (ref 78.0–100.0)
MONO ABS: 0.4 10*3/uL (ref 0.1–1.0)
MONOS PCT: 7 %
NEUTROS ABS: 3.9 10*3/uL (ref 1.7–7.7)
NEUTROS PCT: 60 %
PLATELETS: 237 10*3/uL (ref 150–400)
RBC: 4.18 MIL/uL (ref 3.87–5.11)
RDW: 12.7 % (ref 11.5–15.5)
WBC: 6.5 10*3/uL (ref 4.0–10.5)

## 2016-08-17 LAB — PROTIME-INR
INR: 0.93
Prothrombin Time: 12.4 seconds (ref 11.4–15.2)

## 2016-08-17 LAB — POC OCCULT BLOOD, ED: FECAL OCCULT BLD: NEGATIVE

## 2016-08-17 LAB — TROPONIN I: Troponin I: <0.03 ng/mL (ref <0.03)

## 2016-08-17 LAB — I-STAT CG4 LACTIC ACID, ED: Lactic Acid, Venous: 1.85 mmol/L (ref 0.5–1.9)

## 2016-08-17 LAB — D-DIMER, QUANTITATIVE (NOT AT ARMC): D DIMER QUANT: 0.32 ug{FEU}/mL (ref 0.00–0.50)

## 2016-08-17 MED ORDER — PANTOPRAZOLE SODIUM 40 MG PO TBEC
40.0000 mg | DELAYED_RELEASE_TABLET | Freq: Every day | ORAL | Status: DC
  Administered 2016-08-17 – 2016-08-19 (×3): 40 mg via ORAL
  Filled 2016-08-17 (×3): qty 1

## 2016-08-17 MED ORDER — SODIUM CHLORIDE 0.9 % IV SOLN
INTRAVENOUS | Status: DC
  Administered 2016-08-17 – 2016-08-19 (×4): via INTRAVENOUS

## 2016-08-17 MED ORDER — LEVOFLOXACIN IN D5W 750 MG/150ML IV SOLN
750.0000 mg | Freq: Once | INTRAVENOUS | Status: AC
  Administered 2016-08-17: 750 mg via INTRAVENOUS
  Filled 2016-08-17: qty 150

## 2016-08-17 MED ORDER — IOPAMIDOL (ISOVUE-370) INJECTION 76%
100.0000 mL | Freq: Once | INTRAVENOUS | Status: AC | PRN
  Administered 2016-08-17: 100 mL via INTRAVENOUS

## 2016-08-17 MED ORDER — LEVOFLOXACIN IN D5W 750 MG/150ML IV SOLN
750.0000 mg | INTRAVENOUS | Status: DC
  Administered 2016-08-18 – 2016-08-19 (×2): 750 mg via INTRAVENOUS
  Filled 2016-08-17 (×2): qty 150

## 2016-08-17 MED ORDER — SODIUM CHLORIDE 0.9 % IV SOLN
INTRAVENOUS | Status: AC
  Administered 2016-08-17: 08:00:00 via INTRAVENOUS
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
## 2016-08-17 NOTE — ED Notes (Signed)
After pt ambulated to the restroom, pt began spitting up blood again. EDP aware and will be admitting the pt.

## 2016-08-17 NOTE — ED Triage Notes (Signed)
Pt C/O blood in her vomit since Friday night at 2200. Pt C/O "pain all the way around in my ribs."

## 2016-08-18 DIAGNOSIS — Z7982 Long term (current) use of aspirin: Secondary | ICD-10-CM | POA: Diagnosis not present

## 2016-08-18 DIAGNOSIS — Z8249 Family history of ischemic heart disease and other diseases of the circulatory system: Secondary | ICD-10-CM | POA: Diagnosis not present

## 2016-08-18 DIAGNOSIS — F17210 Nicotine dependence, cigarettes, uncomplicated: Secondary | ICD-10-CM | POA: Diagnosis not present

## 2016-08-18 DIAGNOSIS — K219 Gastro-esophageal reflux disease without esophagitis: Secondary | ICD-10-CM | POA: Diagnosis not present

## 2016-08-18 DIAGNOSIS — Z801 Family history of malignant neoplasm of trachea, bronchus and lung: Secondary | ICD-10-CM | POA: Diagnosis not present

## 2016-08-18 DIAGNOSIS — J440 Chronic obstructive pulmonary disease with acute lower respiratory infection: Secondary | ICD-10-CM | POA: Diagnosis not present

## 2016-08-18 DIAGNOSIS — J181 Lobar pneumonia, unspecified organism: Secondary | ICD-10-CM | POA: Diagnosis not present

## 2016-08-18 DIAGNOSIS — J189 Pneumonia, unspecified organism: Secondary | ICD-10-CM | POA: Diagnosis not present

## 2016-08-18 DIAGNOSIS — R042 Hemoptysis: Secondary | ICD-10-CM | POA: Diagnosis not present

## 2016-08-18 LAB — BASIC METABOLIC PANEL
Anion gap: 4 — ABNORMAL LOW (ref 5–15)
BUN: 8 mg/dL (ref 6–20)
CO2: 23 mmol/L (ref 22–32)
CREATININE: 0.8 mg/dL (ref 0.44–1.00)
Calcium: 8.3 mg/dL — ABNORMAL LOW (ref 8.9–10.3)
Chloride: 110 mmol/L (ref 101–111)
GFR calc Af Amer: >60 mL/min (ref >60)
GLUCOSE: 88 mg/dL (ref 65–99)
Potassium: 4.1 mmol/L (ref 3.5–5.1)
Sodium: 137 mmol/L (ref 135–145)

## 2016-08-18 LAB — CBC
HEMATOCRIT: 32.9 % — AB (ref 36.0–46.0)
Hemoglobin: 11.3 g/dL — ABNORMAL LOW (ref 12.0–15.0)
MCH: 32.9 pg (ref 26.0–34.0)
MCHC: 34.3 g/dL (ref 30.0–36.0)
MCV: 95.9 fL (ref 78.0–100.0)
PLATELETS: 194 10*3/uL (ref 150–400)
RBC: 3.43 MIL/uL — ABNORMAL LOW (ref 3.87–5.11)
RDW: 12.8 % (ref 11.5–15.5)
WBC: 4.6 10*3/uL (ref 4.0–10.5)

## 2016-08-18 LAB — STREP PNEUMONIAE URINARY ANTIGEN: Strep Pneumo Urinary Antigen: NEGATIVE

## 2016-08-18 LAB — HIV ANTIBODY (ROUTINE TESTING W REFLEX): HIV Screen 4th Generation wRfx: NONREACTIVE

## 2016-08-18 MED ORDER — ALBUTEROL SULFATE (2.5 MG/3ML) 0.083% IN NEBU: 2.5000 mg | INHALATION_SOLUTION | RESPIRATORY_TRACT | Status: DC | PRN

## 2016-08-18 NOTE — Progress Notes (Signed)
PROGRESS NOTE                                                                                                                                                                                                             Patient Demographics:    Abigail Evans, is a 71 y.o. female, DOB - [birth year], ZOX:[id number]  Admit date - 08/17/2016   Admitting Physician Starleen Arms, MD  Outpatient Primary MD for the patient is Cassell Smiles, MD  LOS - 0  Chief Complaint  Patient presents with  . Hemoptysis       Brief Narrative   71 y.o. female, With significant past medical history of GERD, presents with complaints of hemoptysis, CTA chest negative for PE, but significant for pneumonia and severe COPD.   Subjective:    Abigail Evans today has, No headache, No chest pain, No abdominal pain - No Nausea, No weakness, still reports cough and hemoptysis, but it is less productive today.  Assessment  & Plan :    Active Problems:   Hemoptysis   Pneumonia   Hemoptysis secondary to pneumonia - It is a significant today per patient, but has not resolved yet, as well she had drop in her hemoglobin 13.7-11.3(some delusional factor from IV fluid), so she will stay for another day to keep close monitoring on her hemoptysis.  - CTA chest negative for PE  Pneumonia - Continue treatment with levofloxacin for community-acquired pneumonia, blood cultures with no growth to date, follow sputum cultures.  History of GERD - Continue with PPI  Tobacco abuse - She was counseled again today, went to length about her significant COPD, the patient determined to stop, continue with nicotine patch  COPD - CT chest with significant severe centrilobular emphysema, no active wheezing, on when necessary nebs.  Code Status : Full  Family Communication  : D/W family at bedside  Disposition Plan  : Home when stable  Consults  :  None  Procedures  : None  DVT  Prophylaxis  : SCDs   Lab Results  Component Value Date   PLT 194 08/18/2016    Antibiotics  :    Anti-infectives    Start     Dose/Rate Route Frequency Ordered Stop   08/18/16 0600  levofloxacin (LEVAQUIN) IVPB 750 mg     750 mg 100  mL/hr over 90 Minutes Intravenous Every 24 hours 08/17/16 0834 08/23/16 0559   08/17/16 0600  levofloxacin (LEVAQUIN) IVPB 750 mg     750 mg 100 mL/hr over 90 Minutes Intravenous  Once 08/17/16 0550 08/17/16 0802        Objective:   Vitals:   08/17/16 0950 08/17/16 1300 08/17/16 2125 08/18/16 0521  BP: (!) 102/54 114/61 (!) 148/55 (!) 133/47  Pulse: 81 71 94 90  Resp: Temp: 98.3 F (36.8 C) 98 F (36.7 C) 98.6 F (37 C) 98.4 F (36.9 C)  TempSrc: Oral Oral Oral Oral  SpO2: 96% 95% 97% 96%  Weight: 61.8 kg (136 lb 3.2 oz)     Height:  (1.651 m)       Wt Readings from Last 3 Encounters:  08/17/16 61.8 kg (136 lb 3.2 oz)  04/11/14 59.7 kg (131 lb 11.2 oz)  02/08/13 59.2 kg (130 lb 9.6 oz)     Intake/Output Summary (Last 24 hours) at 08/18/16 0954 Last data filed at 08/18/16 0615  Gross per 24 hour  Intake          2116.25 ml  Output                0 ml  Net          2116.25 ml     Physical Exam  Awake Alert, Oriented X 3, No new F.N deficits, Normal affect Supple Neck,No JVD Symmetrical Chest wall movement, Good air movement bilaterally, Scattered rails and upper lungs RRR,No Gallops,Rubs or new Murmurs, No Parasternal Heave +ve B.Sounds, Abd Soft, No tenderness, No rebound - guarding or rigidity. No Cyanosis, Clubbing or edema, No new Rash or bruise      Data Review:    CBC  Recent Labs Lab 08/17/16 0420 08/18/16 0605  WBC 6.5 4.6  HGB 13.7 11.3*  HCT 39.3 32.9*  PLT 237 194  MCV 94.0 95.9  MCH 32.8 32.9  MCHC 34.9 34.3  RDW 12.7 12.8  LYMPHSABS 2.0  --   MONOABS 0.4  --   EOSABS 0.2  --   BASOSABS 0.0  --     Chemistries   Recent Labs Lab 08/17/16 0420 08/18/16 0605  NA 138 137    K 3.7 4.1  CL 105 110  CO2 24 23  GLUCOSE 110* 88  BUN 12 8  CREATININE 0.95 0.80  CALCIUM 9.5 8.3*   ------------------------------------------------------------------------------------------------------------------ No results for input(s): CHOL, HDL, LDLCALC, TRIG, CHOLHDL, LDLDIRECT in the last 72 hours.  No results found for: HGBA1C ------------------------------------------------------------------------------------------------------------------ No results for input(s): TSH, T4TOTAL, T3FREE, THYROIDAB in the last 72 hours.  Invalid input(s): FREET3 ------------------------------------------------------------------------------------------------------------------ No results for input(s): VITAMINB12, FOLATE, FERRITIN, TIBC, IRON, RETICCTPCT in the last 72 hours.  Coagulation profile  Recent Labs Lab 08/17/16 0420  INR 0.93     Recent Labs  08/17/16 0420  DDIMER 0.32    Cardiac Enzymes  Recent Labs Lab 08/17/16 0420  TROPONINI <0.03   ------------------------------------------------------------------------------------------------------------------ No results found for: BNP  Inpatient Medications  Scheduled Meds: . pantoprazole  40 mg Oral Abigail   Continuous Infusions: . sodium chloride 75 mL/hr at 08/17/16 2156  . levofloxacin (LEVAQUIN) IV Stopped (08/18/16 0745)   PRN Meds:.  Micro Results Recent Results (from the past 240 hour(s))  Blood culture (routine x 2)     Status: None (Preliminary result)   Collection Time: 08/17/16  6:09 AM  Result Value Ref Range Status  Specimen Description BLOOD LEFT ARM  Final   Special Requests   Final    BOTTLES DRAWN AEROBIC AND ANAEROBIC Blood Culture adequate volume   Culture NO GROWTH 1 DAY  Final   Report Status PENDING  Incomplete  Blood culture (routine x 2)     Status: None (Preliminary result)   Collection Time: 08/17/16  6:16 AM  Result Value Ref Range Status   Specimen Description BLOOD LEFT HAND   Final   Special Requests   Final    BOTTLES DRAWN AEROBIC AND ANAEROBIC Blood Culture adequate volume   Culture NO GROWTH 1 DAY  Final   Report Status PENDING  Incomplete    Radiology Reports Dg Chest 2 View  Result Date: 08/17/2016 CLINICAL DATA:  Hematemesis. EXAM: CHEST  2 VIEW COMPARISON:  Chest CT August 17, 2016 at 0505 hours FINDINGS: The heart size and mediastinal contours are within normal limits. Mildly calcified aortic knob. Both lungs are clear. Increased lung volumes, apical bullous changes and mild chronic interstitial changes. The visualized skeletal structures are nonacute; osteopenia and. IMPRESSION: COPD. Electronically Signed   By: Awilda Metro M.D.   On: 08/17/2016 05:29   Ct Angio Chest Pe W And/or Wo Contrast  Result Date: 08/17/2016 CLINICAL DATA:  71 year old female with hemoptysis. EXAM: CT ANGIOGRAPHY CHEST WITH CONTRAST TECHNIQUE: Multidetector CT imaging of the chest was performed using the standard protocol during bolus administration of intravenous contrast. Multiplanar CT image reconstructions and MIPs were obtained to evaluate the vascular anatomy. CONTRAST:  100 cc Isovue 370 COMPARISON:  Chest radiograph dated 02/09/2015 and CT dated 05/11/2011 FINDINGS: Cardiovascular: There is no cardiomegaly or pericardial effusion. Mild calcified and noncalcified atherosclerotic plaque along the thoracic aorta. No aneurysmal dilatation or evidence of dissection. The origins of the great vessels of the aortic arch appear patent. There is no CT evidence of pulmonary embolism. Mediastinum/Nodes: No hilar or mediastinal adenopathy. Multiple small mediastinal lymph nodes in the aortopulmonic window. The esophagus is grossly unremarkable. Lungs/Pleura: Severe centrilobular emphysema. There is mild bronchiectatic changes primarily involving the lower lobes. Patchy area of ground-glass density at the left lung base and lingula most consistent with pneumonia. Clinical correlation and  follow-up to resolution recommended. Faint area of density in the right middle lobe may represent atelectasis versus developing infiltrate. There are linear scarring of the left lung base. No pleural effusion or pneumothorax. The central airways are patent. Upper Abdomen: Small scattered hypodense lesions of the liver ulna well characterized but appears similar to the prior CT most likely representing cysts or hemangioma. The visualized upper abdomen is otherwise unremarkable. Musculoskeletal: Degenerative changes of the spine. No acute osseous pathology. Review of the MIP images confirms the above findings. IMPRESSION: 1. No CT evidence of pulmonary embolism. 2. Patchy areas of hazy density primarily involving the left lower lobe and lingula most consistent with developing infiltrate. Clinical correlation and follow-up resolution recommended. 3. Severe centrilobular emphysema.  No pneumothorax. Electronically Signed   By: Elgie Collard M.D.   On: 08/17/2016 05:45     ELGERGAWY, DAWOOD M.D on 08/18/2016 at 9:54 AM  Between 7am to 7pm - Pager - [phone]  After 7pm go to [email or web address] - password [email or web address]  [facility] -  Office  [phone]

## 2016-08-19 LAB — CBC
HEMATOCRIT: 31 % — AB (ref 36.0–46.0)
Hemoglobin: 10.6 g/dL — ABNORMAL LOW (ref 12.0–15.0)
MCH: 32.8 pg (ref 26.0–34.0)
MCHC: 34.2 g/dL (ref 30.0–36.0)
MCV: 96 fL (ref 78.0–100.0)
Platelets: 177 10*3/uL (ref 150–400)
RBC: 3.23 MIL/uL — AB (ref 3.87–5.11)
RDW: 12.7 % (ref 11.5–15.5)
WBC: 4.3 10*3/uL (ref 4.0–10.5)

## 2016-08-19 LAB — BASIC METABOLIC PANEL
ANION GAP: 4 — AB (ref 5–15)
BUN: 6 mg/dL (ref 6–20)
CHLORIDE: 107 mmol/L (ref 101–111)
CO2: 26 mmol/L (ref 22–32)
Calcium: 8.6 mg/dL — ABNORMAL LOW (ref 8.9–10.3)
Creatinine, Ser: 0.76 mg/dL (ref 0.44–1.00)
GFR calc Af Amer: >60 mL/min (ref >60)
GFR calc non Af Amer: >60 mL/min (ref >60)
GLUCOSE: 86 mg/dL (ref 65–99)
POTASSIUM: 4.3 mmol/L (ref 3.5–5.1)
Sodium: 137 mmol/L (ref 135–145)

## 2016-08-19 MED ORDER — LEVOFLOXACIN 750 MG PO TABS: 750.0000 mg | ORAL_TABLET | Freq: Every day | ORAL | 0 refills | Status: DC

## 2016-08-19 MED ORDER — SACCHAROMYCES BOULARDII 250 MG PO CAPS: 250.0000 mg | ORAL_CAPSULE | Freq: Two times a day (BID) | ORAL | 0 refills | Status: DC

## 2016-08-19 MED ORDER — NICOTINE 21 MG/24HR TD PT24: 21.0000 mg | MEDICATED_PATCH | Freq: Every day | TRANSDERMAL | Status: DC

## 2016-08-19 MED ORDER — NICOTINE 21 MG/24HR TD PT24: 21.0000 mg | MEDICATED_PATCH | Freq: Every day | TRANSDERMAL | 0 refills | Status: DC
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
## 2016-08-19 NOTE — Care Management Important Message (Signed)
Important Message  Patient Details  Name: Abigail Evans MRN: [id number] Date of Birth: 1945-11-22   Medicare Important Message Given:  Yes    Greysen Swanton, Chrystine Oiler, RN 08/19/2016, 11:19 AM

## 2016-08-19 NOTE — Discharge Instructions (Signed)
Follow with Primary MD Cassell Smiles, MD in 7 days   Get CBC, CMP, 2 view Chest X ray checked  by Primary MD next visit.    Activity: As tolerated with Full fall precautions use walker/cane & assistance as needed   Disposition Home    Diet: Heart Healthy    On your next visit with your primary care physician please Get Medicines reviewed and adjusted.   Please request your Prim.MD to go over all Hospital Tests and Procedure/Radiological results at the follow up, please get all Hospital records sent to your Prim MD by signing hospital release before you go home.   If you experience worsening of your admission symptoms, develop shortness of breath, life threatening emergency, suicidal or homicidal thoughts you must seek medical attention immediately by calling 911 or calling your MD immediately  if symptoms less severe.  You Must read complete instructions/literature along with all the possible adverse reactions/side effects for all the Medicines you take and that have been prescribed to you. Take any new Medicines after you have completely understood and accpet all the possible adverse reactions/side effects.   Do not drive, operating heavy machinery, perform activities at heights, swimming or participation in water activities or provide baby sitting services if your were admitted for syncope or siezures until you have seen by Primary MD or a Neurologist and advised to do so again.  Do not drive when taking Pain medications.    Do not take more than prescribed Pain, Sleep and Anxiety Medications  Special Instructions: If you have smoked or chewed Tobacco  in the last 2 yrs please stop smoking, stop any regular Alcohol  and or any Recreational drug use.  Wear Seat belts while driving.   Please note  You were cared for by a hospitalist during your hospital stay. If you have any questions about your discharge medications or the care you received while you were in the hospital  after you are discharged, you can call the unit and asked to speak with the hospitalist on call if the hospitalist that took care of you is not available. Once you are discharged, your primary care physician will handle any further medical issues. Please note that NO REFILLS for any discharge medications will be authorized once you are discharged, as it is imperative that you return to your primary care physician (or establish a relationship with a primary care physician if you do not have one) for your aftercare needs so that they can reassess your need for medications and monitor your lab values.

## 2016-08-22 LAB — CULTURE, BLOOD (ROUTINE X 2)
Culture: NO GROWTH
Culture: NO GROWTH
SPECIAL REQUESTS: ADEQUATE
SPECIAL REQUESTS: ADEQUATE

## 2016-08-25 DIAGNOSIS — Z719 Counseling, unspecified: Secondary | ICD-10-CM | POA: Diagnosis not present

## 2016-08-25 DIAGNOSIS — Z6822 Body mass index (BMI) 22.0-22.9, adult: Secondary | ICD-10-CM | POA: Diagnosis not present

## 2016-08-25 DIAGNOSIS — F17290 Nicotine dependence, other tobacco product, uncomplicated: Secondary | ICD-10-CM | POA: Diagnosis not present

## 2016-08-25 DIAGNOSIS — Z1389 Encounter for screening for other disorder: Secondary | ICD-10-CM | POA: Diagnosis not present

## 2016-08-25 DIAGNOSIS — J441 Chronic obstructive pulmonary disease with (acute) exacerbation: Secondary | ICD-10-CM | POA: Diagnosis not present

## 2016-08-25 DIAGNOSIS — R042 Hemoptysis: Secondary | ICD-10-CM | POA: Diagnosis not present

## 2016-08-25 DIAGNOSIS — J189 Pneumonia, unspecified organism: Secondary | ICD-10-CM | POA: Diagnosis not present

## 2016-08-26 DIAGNOSIS — Z6822 Body mass index (BMI) 22.0-22.9, adult: Secondary | ICD-10-CM | POA: Diagnosis not present

## 2016-08-26 DIAGNOSIS — Z1389 Encounter for screening for other disorder: Secondary | ICD-10-CM | POA: Diagnosis not present

## 2016-08-26 DIAGNOSIS — Z0000 Encounter for general adult medical examination without abnormal findings: Secondary | ICD-10-CM | POA: Diagnosis not present

## 2016-09-03 DIAGNOSIS — Z1211 Encounter for screening for malignant neoplasm of colon: Secondary | ICD-10-CM | POA: Diagnosis not present

## 2016-11-19 DIAGNOSIS — Z0000 Encounter for general adult medical examination without abnormal findings: Secondary | ICD-10-CM | POA: Diagnosis not present

## 2016-11-19 DIAGNOSIS — Z6823 Body mass index (BMI) 23.0-23.9, adult: Secondary | ICD-10-CM | POA: Diagnosis not present

## 2017-02-18 DIAGNOSIS — H52222 Regular astigmatism, left eye: Secondary | ICD-10-CM | POA: Diagnosis not present

## 2017-02-18 DIAGNOSIS — H5203 Hypermetropia, bilateral: Secondary | ICD-10-CM | POA: Diagnosis not present

## 2017-02-18 DIAGNOSIS — H25813 Combined forms of age-related cataract, bilateral: Secondary | ICD-10-CM | POA: Diagnosis not present

## 2017-02-18 DIAGNOSIS — H524 Presbyopia: Secondary | ICD-10-CM | POA: Diagnosis not present

## 2017-04-15 DIAGNOSIS — J9801 Acute bronchospasm: Secondary | ICD-10-CM | POA: Diagnosis not present

## 2017-04-15 DIAGNOSIS — Z6823 Body mass index (BMI) 23.0-23.9, adult: Secondary | ICD-10-CM | POA: Diagnosis not present

## 2017-04-15 DIAGNOSIS — J329 Chronic sinusitis, unspecified: Secondary | ICD-10-CM | POA: Diagnosis not present

## 2017-04-15 DIAGNOSIS — Z1389 Encounter for screening for other disorder: Secondary | ICD-10-CM | POA: Diagnosis not present

## 2017-04-15 DIAGNOSIS — J449 Chronic obstructive pulmonary disease, unspecified: Secondary | ICD-10-CM | POA: Diagnosis not present

## 2017-04-15 DIAGNOSIS — K219 Gastro-esophageal reflux disease without esophagitis: Secondary | ICD-10-CM | POA: Diagnosis not present

## 2017-04-16 ENCOUNTER — Other Ambulatory Visit (HOSPITAL_COMMUNITY): Payer: Self-pay | Admitting: Internal Medicine

## 2017-04-16 DIAGNOSIS — Z1231 Encounter for screening mammogram for malignant neoplasm of breast: Secondary | ICD-10-CM

## 2017-07-10 DIAGNOSIS — Z1389 Encounter for screening for other disorder: Secondary | ICD-10-CM | POA: Diagnosis not present

## 2017-07-10 DIAGNOSIS — J449 Chronic obstructive pulmonary disease, unspecified: Secondary | ICD-10-CM | POA: Diagnosis not present

## 2017-07-10 DIAGNOSIS — J329 Chronic sinusitis, unspecified: Secondary | ICD-10-CM | POA: Diagnosis not present

## 2017-07-10 DIAGNOSIS — Z6823 Body mass index (BMI) 23.0-23.9, adult: Secondary | ICD-10-CM | POA: Diagnosis not present

## 2017-07-23 ENCOUNTER — Inpatient Hospital Stay (HOSPITAL_COMMUNITY)
Admission: EM | Admit: 2017-07-23 | Discharge: 2017-07-25 | DRG: 192 | Disposition: A | Discharge status: Home / Self Care | Payer: PPO | Attending: Internal Medicine | Admitting: Internal Medicine

## 2017-07-23 ENCOUNTER — Encounter (HOSPITAL_COMMUNITY): Payer: Self-pay

## 2017-07-23 ENCOUNTER — Other Ambulatory Visit: Payer: Self-pay

## 2017-07-23 ENCOUNTER — Emergency Department (HOSPITAL_COMMUNITY): Payer: PPO

## 2017-07-23 DIAGNOSIS — G4700 Insomnia, unspecified: Secondary | ICD-10-CM | POA: Diagnosis not present

## 2017-07-23 DIAGNOSIS — R739 Hyperglycemia, unspecified: Secondary | ICD-10-CM | POA: Diagnosis not present

## 2017-07-23 DIAGNOSIS — R000 Tachycardia, unspecified: Secondary | ICD-10-CM | POA: Diagnosis present

## 2017-07-23 DIAGNOSIS — M1990 Unspecified osteoarthritis, unspecified site: Secondary | ICD-10-CM | POA: Diagnosis not present

## 2017-07-23 DIAGNOSIS — M8580 Other specified disorders of bone density and structure, unspecified site: Secondary | ICD-10-CM | POA: Diagnosis not present

## 2017-07-23 DIAGNOSIS — J441 Chronic obstructive pulmonary disease with (acute) exacerbation: Principal | ICD-10-CM | POA: Diagnosis present

## 2017-07-23 DIAGNOSIS — Z881 Allergy status to other antibiotic agents status: Secondary | ICD-10-CM | POA: Diagnosis not present

## 2017-07-23 DIAGNOSIS — M810 Age-related osteoporosis without current pathological fracture: Secondary | ICD-10-CM

## 2017-07-23 DIAGNOSIS — Z87891 Personal history of nicotine dependence: Secondary | ICD-10-CM | POA: Diagnosis not present

## 2017-07-23 DIAGNOSIS — K219 Gastro-esophageal reflux disease without esophagitis: Secondary | ICD-10-CM | POA: Diagnosis not present

## 2017-07-23 DIAGNOSIS — Z79899 Other long term (current) drug therapy: Secondary | ICD-10-CM

## 2017-07-23 DIAGNOSIS — R0789 Other chest pain: Secondary | ICD-10-CM | POA: Diagnosis not present

## 2017-07-23 DIAGNOSIS — E785 Hyperlipidemia, unspecified: Secondary | ICD-10-CM | POA: Diagnosis present

## 2017-07-23 DIAGNOSIS — T380X5A Adverse effect of glucocorticoids and synthetic analogues, initial encounter: Secondary | ICD-10-CM | POA: Diagnosis not present

## 2017-07-23 DIAGNOSIS — J3489 Other specified disorders of nose and nasal sinuses: Secondary | ICD-10-CM | POA: Diagnosis not present

## 2017-07-23 DIAGNOSIS — R0902 Hypoxemia: Secondary | ICD-10-CM | POA: Diagnosis present

## 2017-07-23 DIAGNOSIS — Z66 Do not resuscitate: Secondary | ICD-10-CM | POA: Diagnosis present

## 2017-07-23 DIAGNOSIS — Z780 Asymptomatic menopausal state: Secondary | ICD-10-CM | POA: Diagnosis present

## 2017-07-23 DIAGNOSIS — R0602 Shortness of breath: Secondary | ICD-10-CM | POA: Diagnosis not present

## 2017-07-23 HISTORY — DX: Chronic obstructive pulmonary disease, unspecified: J44.9

## 2017-07-23 LAB — CBC WITH DIFFERENTIAL/PLATELET
Basophils Absolute: 0 10*3/uL (ref 0.0–0.1)
Basophils Relative: 0 %
EOS PCT: 7 %
Eosinophils Absolute: 0.6 10*3/uL (ref 0.0–0.7)
HEMATOCRIT: 47 % — AB (ref 36.0–46.0)
Hemoglobin: 15.7 g/dL — ABNORMAL HIGH (ref 12.0–15.0)
LYMPHS ABS: 1.7 10*3/uL (ref 0.7–4.0)
LYMPHS PCT: 20 %
MCH: 32 pg (ref 26.0–34.0)
MCHC: 33.4 g/dL (ref 30.0–36.0)
MCV: 95.9 fL (ref 78.0–100.0)
MONO ABS: 0.8 10*3/uL (ref 0.1–1.0)
MONOS PCT: 10 %
NEUTROS ABS: 5.4 10*3/uL (ref 1.7–7.7)
Neutrophils Relative %: 63 %
PLATELETS: 236 10*3/uL (ref 150–400)
RBC: 4.9 MIL/uL (ref 3.87–5.11)
RDW: 12.7 % (ref 11.5–15.5)
WBC: 8.6 10*3/uL (ref 4.0–10.5)

## 2017-07-23 LAB — BASIC METABOLIC PANEL
Anion gap: 11 (ref 5–15)
BUN: 14 mg/dL (ref 6–20)
CALCIUM: 9.7 mg/dL (ref 8.9–10.3)
CO2: 26 mmol/L (ref 22–32)
CREATININE: 0.86 mg/dL (ref 0.44–1.00)
Chloride: 100 mmol/L — ABNORMAL LOW (ref 101–111)
GFR calc Af Amer: >60 mL/min (ref >60)
GFR calc non Af Amer: >60 mL/min (ref >60)
GLUCOSE: 82 mg/dL (ref 65–99)
Potassium: 4.1 mmol/L (ref 3.5–5.1)
Sodium: 137 mmol/L (ref 135–145)

## 2017-07-23 LAB — HEPATIC FUNCTION PANEL
ALK PHOS: 70 U/L (ref 38–126)
ALT: 21 U/L (ref 14–54)
AST: 24 U/L (ref 15–41)
Albumin: 3.9 g/dL (ref 3.5–5.0)
BILIRUBIN DIRECT: 0.1 mg/dL (ref 0.1–0.5)
BILIRUBIN INDIRECT: 0.6 mg/dL (ref 0.3–0.9)
BILIRUBIN TOTAL: 0.7 mg/dL (ref 0.3–1.2)
Total Protein: 7.4 g/dL (ref 6.5–8.1)

## 2017-07-23 LAB — TROPONIN I: Troponin I: <0.03 ng/mL (ref <0.03)

## 2017-07-23 LAB — MAGNESIUM: Magnesium: 2.2 mg/dL (ref 1.7–2.4)

## 2017-07-23 LAB — BRAIN NATRIURETIC PEPTIDE: B Natriuretic Peptide: 19 pg/mL (ref 0.0–100.0)

## 2017-07-23 MED ORDER — ACETAMINOPHEN 325 MG PO TABS: 650.0000 mg | ORAL_TABLET | Freq: Four times a day (QID) | ORAL | Status: DC | PRN

## 2017-07-23 MED ORDER — ACETAMINOPHEN 650 MG RE SUPP: 650.0000 mg | Freq: Four times a day (QID) | RECTAL | Status: DC | PRN

## 2017-07-23 MED ORDER — DM-GUAIFENESIN ER 30-600 MG PO TB12
1.0000 | ORAL_TABLET | Freq: Two times a day (BID) | ORAL | Status: DC
  Administered 2017-07-23 – 2017-07-25 (×4): 1 via ORAL
  Filled 2017-07-23 (×4): qty 1

## 2017-07-23 MED ORDER — ONDANSETRON HCL 4 MG PO TABS: 4.0000 mg | ORAL_TABLET | Freq: Four times a day (QID) | ORAL | Status: DC | PRN

## 2017-07-23 MED ORDER — ALBUTEROL (5 MG/ML) CONTINUOUS INHALATION SOLN
15.0000 mg/h | INHALATION_SOLUTION | Freq: Once | RESPIRATORY_TRACT | Status: AC
  Administered 2017-07-23: 15 mg/h via RESPIRATORY_TRACT
  Filled 2017-07-23: qty 20

## 2017-07-23 MED ORDER — ENOXAPARIN SODIUM 40 MG/0.4ML ~~LOC~~ SOLN
40.0000 mg | SUBCUTANEOUS | Status: DC
  Administered 2017-07-23 – 2017-07-25 (×3): 40 mg via SUBCUTANEOUS
  Filled 2017-07-23 (×3): qty 0.4

## 2017-07-23 MED ORDER — IPRATROPIUM BROMIDE 0.02 % IN SOLN
0.5000 mg | Freq: Once | RESPIRATORY_TRACT | Status: AC
  Administered 2017-07-23: 0.5 mg via RESPIRATORY_TRACT
  Filled 2017-07-23: qty 2.5

## 2017-07-23 MED ORDER — IPRATROPIUM-ALBUTEROL 0.5-2.5 (3) MG/3ML IN SOLN
3.0000 mL | Freq: Once | RESPIRATORY_TRACT | Status: AC
Start: 2017-07-23 — End: 2017-07-23
  Administered 2017-07-23: 3 mL via RESPIRATORY_TRACT
  Filled 2017-07-23: qty 3

## 2017-07-23 MED ORDER — ALBUTEROL SULFATE HFA 108 (90 BASE) MCG/ACT IN AERS: 2.0000 | INHALATION_SPRAY | Freq: Four times a day (QID) | RESPIRATORY_TRACT | 0 refills | Status: DC | PRN

## 2017-07-23 MED ORDER — METHYLPREDNISOLONE SODIUM SUCC 125 MG IJ SOLR
125.0000 mg | Freq: Once | INTRAMUSCULAR | Status: AC
  Administered 2017-07-23: 125 mg via INTRAVENOUS
  Filled 2017-07-23: qty 2

## 2017-07-23 MED ORDER — AZITHROMYCIN 250 MG PO TABS: 250.0000 mg | ORAL_TABLET | Freq: Every day | ORAL | 0 refills | Status: DC

## 2017-07-23 MED ORDER — CALCIUM CARBONATE-VITAMIN D 500-200 MG-UNIT PO TABS
1.0000 | ORAL_TABLET | Freq: Every day | ORAL | Status: DC
  Administered 2017-07-24 – 2017-07-25 (×2): 1 via ORAL
  Filled 2017-07-23 (×2): qty 1

## 2017-07-23 MED ORDER — IPRATROPIUM-ALBUTEROL 0.5-2.5 (3) MG/3ML IN SOLN
3.0000 mL | Freq: Four times a day (QID) | RESPIRATORY_TRACT | Status: DC
  Administered 2017-07-23: 3 mL via RESPIRATORY_TRACT
  Filled 2017-07-23: qty 3

## 2017-07-23 MED ORDER — FLUTICASONE PROPIONATE 50 MCG/ACT NA SUSP
1.0000 | Freq: Every day | NASAL | Status: DC
  Administered 2017-07-23 – 2017-07-25 (×3): 1 via NASAL
  Filled 2017-07-23: qty 16

## 2017-07-23 MED ORDER — CEFDINIR 300 MG PO CAPS
300.0000 mg | ORAL_CAPSULE | Freq: Two times a day (BID) | ORAL | Status: DC
  Administered 2017-07-23 – 2017-07-25 (×4): 300 mg via ORAL
  Filled 2017-07-23 (×5): qty 1

## 2017-07-23 MED ORDER — ONDANSETRON HCL 4 MG/2ML IJ SOLN: 4.0000 mg | Freq: Four times a day (QID) | INTRAMUSCULAR | Status: DC | PRN

## 2017-07-23 MED ORDER — BUDESONIDE 0.5 MG/2ML IN SUSP
0.5000 mg | Freq: Two times a day (BID) | RESPIRATORY_TRACT | Status: DC
  Administered 2017-07-23 – 2017-07-25 (×4): 0.5 mg via RESPIRATORY_TRACT
  Filled 2017-07-23 (×4): qty 2

## 2017-07-23 MED ORDER — IPRATROPIUM-ALBUTEROL 0.5-2.5 (3) MG/3ML IN SOLN
3.0000 mL | Freq: Three times a day (TID) | RESPIRATORY_TRACT | Status: DC
  Administered 2017-07-24 – 2017-07-25 (×4): 3 mL via RESPIRATORY_TRACT
  Filled 2017-07-23 (×4): qty 3

## 2017-07-23 MED ORDER — PANTOPRAZOLE SODIUM 40 MG PO TBEC
40.0000 mg | DELAYED_RELEASE_TABLET | Freq: Every day | ORAL | Status: DC
  Administered 2017-07-23 – 2017-07-25 (×3): 40 mg via ORAL
  Filled 2017-07-23 (×3): qty 1

## 2017-07-23 MED ORDER — SODIUM CHLORIDE 0.9 % IV SOLN
INTRAVENOUS | Status: AC
  Administered 2017-07-23: 17:00:00 via INTRAVENOUS

## 2017-07-23 MED ORDER — OMEGA-3-ACID ETHYL ESTERS 1 G PO CAPS
1.0000 g | ORAL_CAPSULE | Freq: Every day | ORAL | Status: DC
  Administered 2017-07-24 – 2017-07-25 (×2): 1 g via ORAL
  Filled 2017-07-23 (×3): qty 1

## 2017-07-23 MED ORDER — METHYLPREDNISOLONE SODIUM SUCC 125 MG IJ SOLR
60.0000 mg | Freq: Three times a day (TID) | INTRAMUSCULAR | Status: DC
  Administered 2017-07-23 – 2017-07-25 (×5): 60 mg via INTRAVENOUS
  Filled 2017-07-23 (×6): qty 2

## 2017-07-23 MED ORDER — MAGNESIUM SULFATE 2 GM/50ML IV SOLN
2.0000 g | Freq: Once | INTRAVENOUS | Status: AC
  Administered 2017-07-23: 2 g via INTRAVENOUS
  Filled 2017-07-23: qty 50

## 2017-07-23 MED ORDER — ALBUTEROL SULFATE (2.5 MG/3ML) 0.083% IN NEBU
2.5000 mg | INHALATION_SOLUTION | Freq: Once | RESPIRATORY_TRACT | Status: AC
  Administered 2017-07-23: 2.5 mg via RESPIRATORY_TRACT
  Filled 2017-07-23: qty 3

## 2017-07-23 MED ORDER — PREDNISONE 50 MG PO TABS: ORAL_TABLET | ORAL | 0 refills | Status: DC

## 2017-07-23 NOTE — ED Provider Notes (Signed)
Despite continuous nebulizer and Solu-Medrol and magnesium patient still wheezing and when she ambulates desats down to 88%.  Most likely exacerbation of COPD.  Family member does states there possibly could have been chemical irritant as a trigger as well.  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  Will call hospitalist for admission.  Patient still wheezing however stable at rest with normal oxygen sats.  Hopefully she will improve as the steroids become active.   Vanetta MuldersZackowski, Carlise Stofer, MD 07/23/17 [phone]

## 2017-07-23 NOTE — ED Triage Notes (Signed)
Pt reports sob for a couple of weeks, saw her pmd at onset and received a steroid shot.  Pt states breathing improved for a short time and now is worse again.  Pt reports chest tightness and increased cough.
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
## 2017-07-23 NOTE — ED Notes (Signed)
Pt O2 dropped to 88% while ambulating.

## 2017-07-24 DIAGNOSIS — G4700 Insomnia, unspecified: Secondary | ICD-10-CM

## 2017-07-24 LAB — BASIC METABOLIC PANEL
Anion gap: 8 (ref 5–15)
BUN: 14 mg/dL (ref 6–20)
CALCIUM: 8.8 mg/dL — AB (ref 8.9–10.3)
CO2: 24 mmol/L (ref 22–32)
CREATININE: 0.72 mg/dL (ref 0.44–1.00)
Chloride: 102 mmol/L (ref 101–111)
GFR calc Af Amer: >60 mL/min (ref >60)
GLUCOSE: 126 mg/dL — AB (ref 65–99)
Potassium: 4.8 mmol/L (ref 3.5–5.1)
SODIUM: 134 mmol/L — AB (ref 135–145)

## 2017-07-24 LAB — CBC
HEMATOCRIT: 38.4 % (ref 36.0–46.0)
Hemoglobin: 12.8 g/dL (ref 12.0–15.0)
MCH: 32 pg (ref 26.0–34.0)
MCHC: 33.3 g/dL (ref 30.0–36.0)
MCV: 96 fL (ref 78.0–100.0)
PLATELETS: 212 10*3/uL (ref 150–400)
RBC: 4 MIL/uL (ref 3.87–5.11)
RDW: 12.8 % (ref 11.5–15.5)
WBC: 10.9 10*3/uL — ABNORMAL HIGH (ref 4.0–10.5)

## 2017-07-24 MED ORDER — TEMAZEPAM 7.5 MG PO CAPS
7.5000 mg | ORAL_CAPSULE | Freq: Every evening | ORAL | Status: DC | PRN
  Filled 2017-07-24: qty 1

## 2017-07-24 NOTE — Care Management Important Message (Signed)
Important Message  Patient Details  Name: Abigail Evans MRN: [id number] Date of Birth: 1945/11/30   Medicare Important Message Given:  Yes    Renie OraHawkins, Danyal Adorno Smith 07/24/2017, 10:19 AM

## 2017-07-24 NOTE — Progress Notes (Signed)
Initial Nutrition Assessment   INTERVENTION:  Provided Nutrition therapy for COPD   NUTRITION DIAGNOSIS:   Inadequate oral intake related to decreased appetite(for the past week) as evidenced by per patient/family report.   GOAL:   Patient will meet greater than or equal to 90% of their needs  MONITOR:   PO intake, Weight trends, Labs  REASON FOR ASSESSMENT:   Consult Assessment of nutrition requirement/status, COPD Protocol  ASSESSMENT:  Patient is a 72 yo female with a hx of COPD who presents with shortness of breath, cough and wheezing.  She endorses decreased appetite the past week prior to admission.  Patient has a hx of dysphagia but denies difficulty with the regular textures of her current diet. She is eating a sandwich for lunch today. At home follows a regular diet. Lives with her husband.  There are not concerns with wt changes.Patient has a stable wt hx 130-136 lb range the past 6 years. Labs and meds reviewed below. Physical exam unremarkable.   BMP Latest Ref Rng & Units 07/24/2017 07/23/2017 08/19/2016  Glucose 65 - 99 mg/dL 621(H126(H) 82 86  BUN 6 - 20 mg/dL 14 14 6   Creatinine 0.44 - 1.00 mg/dL 0.860.72 5.780.86 4.690.76  Sodium 135 - 145 mmol/L 134(L) 137 137  Potassium 3.5 - 5.1 mmol/L 4.8 4.1 4.3  Chloride 101 - 111 mmol/L 102 100(L) 107  CO2 22 - 32 mmol/L 24 26 26   Calcium 8.9 - 10.3 mg/dL 6.2(X8.8(L) 9.7 5.2(W8.6(L)     Meds: . budesonide (PULMICORT) nebulizer solution  0.5 mg Nebulization BID  . calcium-vitamin D  1 tablet Oral Daily  . cefdinir  300 mg Oral Q12H  . dextromethorphan-guaiFENesin  1 tablet Oral BID  . enoxaparin (LOVENOX) injection  40 mg Subcutaneous Q24H  . fluticasone  1 spray Each Nare Daily  . ipratropium-albuterol  3 mL Nebulization TID  . methylPREDNISolone (SOLU-MEDROL) injection  60 mg Intravenous Q8H  . omega-3 acid ethyl esters  1 g Oral Daily  . pantoprazole  40 mg Oral Daily    NUTRITION - FOCUSED PHYSICAL EXAM:    Most Recent Value   Orbital Region  No depletion  Upper Arm Region  No depletion  Thoracic and Lumbar Region  No depletion  Buccal Region  No depletion  Temple Region  No depletion  Clavicle Bone Region  No depletion  Clavicle and Acromion Bone Region  No depletion  Scapular Bone Region  No depletion  Dorsal Hand  No depletion  Patellar Region  Unable to assess  Anterior Thigh Region  Unable to assess  Posterior Calf Region  Unable to assess  Edema (RD Assessment)  None  Hair  Reviewed  Eyes  Reviewed  Mouth  Reviewed      Diet Order:  Diet regular Room service appropriate? Yes; Fluid consistency: Thin  EDUCATION NEEDS:   Education needs have been addressed(COPD focused)Skin:  Skin Assessment: Reviewed RN Assessment  Last BM:  4/5   Height:   Ht Readings from Last 1 Encounters:  07/23/17 5\' 4"  (1.626 m)    Weight:   Wt Readings from Last 1 Encounters:  07/23/17 134 lb 4.2 oz (60.9 kg)    Ideal Body Weight:  55 kg  BMI:  Body mass index is 23.05 kg/m.  Estimated Nutritional Needs:   Kcal:  4132-44011525-1708   Protein:  73-80 gr   Fluid:  > 1500 ml daily   Royann ShiversLynn Susann Lawhorne MS,RD,CSG,LDN Office: [phone] Pager: [phone]

## 2017-07-24 NOTE — Progress Notes (Signed)
[facility] PROGRESS NOTE  Abigail Evans:[id number] DOB: 1945/10/21 DOA: 07/23/2017 PCP: Elfredia Nevins, MD   Interim summary and HPI: Abigail Evans is a 71 year old female with a past medical history significant for COPD, gastroesophageal reflux disease, arthritis, osteopenia and dyslipidemia; who presented to the emergency department secondary to increased shortness of breath, productive cough and increased wheezing for the past 10 days.   Patient denies fever, n/v, abdominal pain, hematuria, dysuria, melena, hematochezia, focal weakness or any other acute complaints.  Assessment/Plan: Acute COPD exacerbation (HCC): In the setting of bronchiectasis and with mild hypoxia on presentation (oxygen saturation 93% on exertion while on room air). -Following COPD protocol admission order set (on Cefdinir twice a day, IV steroids, Pulmicort nebulization, DuoNeb 4 times a day, Mucinex and flutter valve). -Patient's oxygen saturation is at 93% on room air.  -still SOB with exertion and with diffuse wheezing; but improving.  -Follow clinical response -Chest x-ray on admission  failed to demonstrate acute infiltrates; but demonstrated chronic bronchitic changes and hyperexpansion suggesting COPD.  GERD (gastroesophageal reflux disease) -Denies nausea or heart burn currently -Will continue Protonix  Hyperglycemia -CBG At 126, probably due to use of IV steroids; no prior hx of DM -At this moment not requiring any medications. -Will monitor CBG's, if glucose raises above 200, then will proceed with management.   Osteopenia after menopause -Continue calcium and Vit D  Dyslipidemia -Continue Lovaza  Tachycardia -In the setting of continue/prolonged albuterol use -Has resolved and is in the 90s currently  -no CP -Mg at 2.2 and potassium WNL  Arthritis -Chronic and stable -Will continue PRN analgesics.  Former Smoker -Patient reported she quit almost a year ago -Patient  advised to keep herself tobacco free and to avoid secondhand smoking as well -No nicotine patch needed at this time.  Insomnia -most likely related to steroids -will use restoril PRN  Code Status: DNR Family Communication: none at bedside Disposition Plan: Home when patient is stable, without shortness of breath. Continue nebulizers and IV steroids.    Consultants:  None  Procedures:  Chest X-ray See results below  Antibiotics:  Cefdinir 4/4  HPI/Subjective: Afebrile, patient reports feeling better, but still complaining of shortness of breath on exertion. Denies chest pain, nausea and vomiting. Complaining of lack of sleep for approximately 36 hours. Not using O2 sat; but with significant diffuse wheezing.   Objective: Vitals:   07/23/17 2228 07/24/17 0520  BP: (!) 117/45 127/61  Pulse: 98 98  Resp: 20   Temp: 98.6 F (37 C) 98.3 F (36.8 C)  SpO2: 99% 93%    Intake/Output Summary (Last 24 hours) at 07/24/2017 1142 Last data filed at 07/24/2017 0900 Gross per 24 hour  Intake 1320 ml  Output 0 ml  Net 1320 ml   Filed Weights   07/23/17 0553 07/23/17 1012  Weight: 60.8 kg (134 lb) 60.9 kg (134 lb 4.2 oz)    Exam:   General: Afebrile. Awake, alert, oriented x 3. Patient is talking and answering questions appropriately. Still SOB on exertion. Patient is reporting lack of sleep. Denies CP.   Cardiovascular: RRR. S1 and S2 appreciated. No rubs, gallops or murmurs. No JVD  Respiratory: Positive diffuse bilateral wheezing. Scattered rhonchi. No crackles. No use of accessory muscles.  Abdomen: Soft, non distended, non tender to palpation. No organomegaly or masses felt. Positive BS x 4 quadrants.   Musculoskeletal: No cyanosis, clubbing or edema. +pulses throughout.   Skin: no rashes, lesions or ulcers.  Psychiatric:  Mood and affect appear normal. Normal judgement and insight.   Data Reviewed: Basic Metabolic Panel: Recent Labs  Lab 07/23/17 0557  07/23/17 1727 07/24/17 0433  NA 137  --  134*  K 4.1  --  4.8  CL 100*  --  102  CO2 26  --  24  GLUCOSE 82  --  126*  BUN 14  --  14  CREATININE 0.86  --  0.72  CALCIUM 9.7  --  8.8*  MG  --  2.2  --    Liver Function Tests: Recent Labs  Lab 07/23/17 1727  AST 24  ALT 21  ALKPHOS 70  BILITOT 0.7  PROT 7.4  ALBUMIN 3.9   CBC: Recent Labs  Lab 07/23/17 0557 07/24/17 0433  WBC 8.6 10.9*  NEUTROABS 5.4  --   HGB 15.7* 12.8  HCT 47.0* 38.4  MCV 95.9 96.0  PLT 236 212   Cardiac Enzymes: Recent Labs  Lab 07/23/17 0557  TROPONINI <0.03   BNP (last 3 results) Recent Labs    07/23/17 0557  BNP 19.0    Studies: Dg Chest 2 View  Result Date: 07/23/2017 CLINICAL DATA:  Shortness of breath and congestion EXAM: CHEST - 2 VIEW COMPARISON:  Chest radiograph 08/17/2016 FINDINGS: The lungs are hyperinflated. No focal airspace consolidation or pulmonary edema. No pleural effusion or pneumothorax. IMPRESSION: COPD without acute airspace disease. Electronically Signed   By: Deatra RobinsonKevin  Herman M.D.   On: 07/23/2017 07:00    Scheduled Meds: . budesonide (PULMICORT) nebulizer solution  0.5 mg Nebulization BID  . calcium-vitamin D  1 tablet Oral Daily  . cefdinir  300 mg Oral Q12H  . dextromethorphan-guaiFENesin  1 tablet Oral BID  . enoxaparin (LOVENOX) injection  40 mg Subcutaneous Q24H  . fluticasone  1 spray Each Nare Daily  . ipratropium-albuterol  3 mL Nebulization TID  . methylPREDNISolone (SOLU-MEDROL) injection  60 mg Intravenous Q8H  . omega-3 acid ethyl esters  1 g Oral Daily  . pantoprazole  40 mg Oral Daily     Time spent: 30 minutes    Vassie Lollarlos Chasitty Hehl  [facility] Pager [phone]. If 7PM-7AM, please contact night-coverage at [email or web address], password [email or web address] 07/24/2017, 11:42 AM  LOS: 1 day

## 2017-07-25 MED ORDER — TEMAZEPAM 7.5 MG PO CAPS: 7.5000 mg | ORAL_CAPSULE | Freq: Every evening | ORAL | 0 refills | Status: DC | PRN

## 2017-07-25 MED ORDER — DM-GUAIFENESIN ER 30-600 MG PO TB12: 1.0000 | ORAL_TABLET | Freq: Two times a day (BID) | ORAL | 0 refills | Status: DC

## 2017-07-25 MED ORDER — FLUTICASONE-SALMETEROL 250-50 MCG/DOSE IN AEPB: 1.0000 | INHALATION_SPRAY | Freq: Two times a day (BID) | RESPIRATORY_TRACT | 2 refills | Status: DC

## 2017-07-25 MED ORDER — IPRATROPIUM-ALBUTEROL 20-100 MCG/ACT IN AERS: 1.0000 | INHALATION_SPRAY | Freq: Four times a day (QID) | RESPIRATORY_TRACT | 2 refills | Status: DC | PRN

## 2017-07-25 MED ORDER — CEFDINIR 300 MG PO CAPS: 300.0000 mg | ORAL_CAPSULE | Freq: Two times a day (BID) | ORAL | 0 refills | Status: DC

## 2017-07-25 MED ORDER — PANTOPRAZOLE SODIUM 40 MG PO TBEC: 40.0000 mg | DELAYED_RELEASE_TABLET | Freq: Every day | ORAL | 0 refills | Status: DC

## 2017-07-25 MED ORDER — PREDNISONE 20 MG PO TABS: ORAL_TABLET | ORAL | 0 refills | Status: DC

## 2017-07-25 NOTE — Discharge Summary (Signed)
Physician Discharge Summary  Abigail Evans JXB:[id number] DOB: [birth year] DOA: 07/23/2017  PCP: Elfredia Nevins, MD  Admit date: 07/23/2017 Discharge date: 07/25/2017  Time spent: 35 minutes  Recommendations for Outpatient Follow-up:  1. Repeat basic metabolic panel to follow electrolytes and renal function 2. Reassess resolution of patient's symptoms 3. Arrange outpatient follow-up with pulmonologist for PFTs and further long-term decision in the treatment of her COPD.  Discharge Diagnoses:  Principal Problem:   COPD exacerbation (HCC) Active Problems:   GERD (gastroesophageal reflux disease)   Osteopenia after menopause   Tachycardia   Arthritis   Hypoxia   Discharge Condition: Stable and improved.  Patient discharged home with instruction to follow-up with PCP in 10 days.  Diet recommendation: Regular diet  Filed Weights   07/23/17 0553 07/23/17 1012  Weight: 60.8 kg (134 lb) 60.9 kg (134 lb 4.2 oz)    History of present illness:  72 year old female with a past medical history significant for COPD, gastroesophageal reflux disease, arthritis, osteopenia and dyslipidemia; who presented to the emergency department secondary to increased shortness of breath, productive cough and increased wheezing.  Patient reported that her symptoms has been going on for the last 10 days and is by [facility] to her PCP where she received a shot of his steroids and an antibiotic for 5 days she had not really experienced resolution of pain.  She reports that her sputum is clear, no hemoptysis, no chest pain.  She also expressed difficulty completing her activities of daily living secondary to the shortness of breath and expressed increased wheezing despite multiple use of her albuterol inhaler at home. Patient denies fever, nausea, vomiting, abdominal pain, hematuria, dysuria, melena, hematochezia, focal weakness or any other acute complaints.  Hospital Course:  AcuteCOPD exacerbation [facility]):In the setting of  bronchiectasis and with mild hypoxia on presentation (oxygen saturation 93% on exertion while on room air). -Patient will be discharged on a steroids tapering, as needed use of Combivent, initiation of Advair twice a day for maintenance, completion of oral cefdinir, recommended to follow-up as an outpatient with pulmonologist for PFTs. -Patient's oxygen saturation is at 93-96% on room air.  -Patient with significant improvement in her air movement, no longer complaining of shortness of breath and just with very little wheezing on expiration.  -Chest x-ray on admission  failed to demonstrate acute infiltrates; but demonstrated chronic bronchitic changes and hyperexpansion suggesting COPD.  GERD (gastroesophageal reflux disease) -Denies nausea or heart burn currently -Will continue Protonix discharge.  Hyperglycemia -CBG At 126, probably due to use of IV steroids; no prior hx of DM -At this moment not requiring any medications. -repeat CBG's as an outpatient.   Osteopenia after menopause -Continue calcium and Vit D  Dyslipidemia -Continue Lovaza  Tachycardia -In the setting of continue/prolonged albuterol use -Has resolved and is in the 90s currently  -no CP -Mg at 2.2 and potassium WNL  Arthritis -Chronic and stable -Will continue PRN analgesics.  Former Smoker -Patient reported she quit almost a year ago -Patient advised to keep herself tobacco free and to avoid secondhand smoking as well -No nicotine patch needed at this time.  Insomnia -most likely related to steroids -will use restoril PRN  Procedures:  See below for x-ray reports.  Consultations:  None  Discharge Exam: Vitals:   07/25/17 0800 07/25/17 0803  BP:    Pulse:    Resp:    Temp:    SpO2: 94% 94%     General:  No fever, awake alert and  oriented x3; patient speaking in full sentences and reporting significant improvement in her breathing.  Denies chest pain.  Cardiovascular: RRR. S1 and  S2 appreciated. No rubs, gallops or murmurs. No JVD  Respiratory:  Very mild expiratory wheezing, no crackles, improved air movement bilaterally, no using accessory muscles.  Good oxygen saturation on room air.    Abdomen: Soft, non distended, non tender to palpation. No organomegaly or masses felt. Positive BS x 4 quadrants.   Musculoskeletal: No cyanosis, clubbing or edema. +pulses throughout.   Skin: no rashes, lesions or ulcers.  Psychiatric: Mood and affect appear normal. Normal judgement and insight.    Discharge Instructions   Discharge Instructions    Discharge instructions   Complete by:  As directed    Take medications as prescribed Keep yourself well-hydrated Arrange follow-up with PCP in 10 days Keep yourself tobacco free and avoid secondhand smoking.     Allergies as of 07/25/2017      Reactions   Doxycycline Nausea And Vomiting      Medication List    TAKE these medications   cefdinir 300 MG capsule Commonly known as:  OMNICEF Take 1 capsule (300 mg total) by mouth every 12 (twelve) hours.   CITRACAL/VITAMIN D PO Take by mouth daily.   dextromethorphan-guaiFENesin 30-600 MG 12hr tablet Commonly known as:  MUCINEX DM Take 1 tablet by mouth 2 (two) times daily.   fish oil-omega-3 fatty acids 1000 MG capsule Take 1 g by mouth daily.   Fluticasone-Salmeterol 250-50 MCG/DOSE Aepb Commonly known as:  ADVAIR DISKUS Inhale 1 puff into the lungs every 12 (twelve) hours.   Ipratropium-Albuterol 20-100 MCG/ACT Aers respimat Commonly known as:  COMBIVENT RESPIMAT Inhale 1 puff into the lungs every 6 (six) hours as needed for wheezing or shortness of breath.   pantoprazole 40 MG tablet Commonly known as:  PROTONIX Take 1 tablet (40 mg total) by mouth daily.   predniSONE 20 MG tablet Commonly known as:  DELTASONE Take 3 tablets by mouth daily X 1 day; then 2 tablets by mouth daily X 2 days; then 1 tablet by mouth daily X 2 days; then half tablet by mouth  daily X 3 days and then stop prednisone.   temazepam 7.5 MG capsule Commonly known as:  RESTORIL Take 1 capsule (7.5 mg total) by mouth at bedtime as needed for sleep.      Allergies  Allergen Reactions  . Doxycycline Nausea And Vomiting   Follow-up Information    Elfredia Nevins, MD. Schedule an appointment as soon as possible for a visit in 10 day(s).   Specialty:  Internal Medicine Contact information: [address] [place] [address] [phone]           The results of significant diagnostics from this hospitalization (including imaging, microbiology, ancillary and laboratory) are listed below for reference.    Significant Diagnostic Studies: Dg Chest 2 View  Result Date: 07/23/2017 CLINICAL DATA:  Shortness of breath and congestion EXAM: CHEST - 2 VIEW COMPARISON:  Chest radiograph 08/17/2016 FINDINGS: The lungs are hyperinflated. No focal airspace consolidation or pulmonary edema. No pleural effusion or pneumothorax. IMPRESSION: COPD without acute airspace disease. Electronically Signed   By: Deatra Robinson M.D.   On: 07/23/2017 07:00   Labs: Basic Metabolic Panel: Recent Labs  Lab 07/23/17 0557 07/23/17 1727 07/24/17 0433  NA 137  --  134*  K 4.1  --  4.8  CL 100*  --  102  CO2 26  --  24  GLUCOSE 82  --  126*  BUN 14  --  14  CREATININE 0.86  --  0.72  CALCIUM 9.7  --  8.8*  MG  --  2.2  --    Liver Function Tests: Recent Labs  Lab 07/23/17 1727  AST 24  ALT 21  ALKPHOS 70  BILITOT 0.7  PROT 7.4  ALBUMIN 3.9   CBC: Recent Labs  Lab 07/23/17 0557 07/24/17 0433  WBC 8.6 10.9*  NEUTROABS 5.4  --   HGB 15.7* 12.8  HCT 47.0* 38.4  MCV 95.9 96.0  PLT 236 212   Cardiac Enzymes: Recent Labs  Lab 07/23/17 0557  TROPONINI <0.03   BNP: BNP (last 3 results) Recent Labs    07/23/17 0557  BNP 19.0    Signed:  Vassie Lollarlos Roger Fasnacht MD.  [facility] 07/25/2017, 11:58 AM

## 2017-07-25 NOTE — Progress Notes (Signed)
Ambulating in room and dressed.  IV removed and scripts and discharge papers explained.  Niece at bedside and to drive home

## 2017-07-25 NOTE — Discharge Instructions (Signed)
Use your inhaler every 4 hours for the next 3 days and then every 4 hours as needed. Take the steroids and antibiotics as prescribed. Followup with your doctor. Return to the ED if you develop new or worsening symptoms.  See primary doctor in 10 days

## 2017-07-30 DIAGNOSIS — Z6823 Body mass index (BMI) 23.0-23.9, adult: Secondary | ICD-10-CM | POA: Diagnosis not present

## 2017-07-30 DIAGNOSIS — K219 Gastro-esophageal reflux disease without esophagitis: Secondary | ICD-10-CM | POA: Diagnosis not present

## 2017-07-30 DIAGNOSIS — J9600 Acute respiratory failure, unspecified whether with hypoxia or hypercapnia: Secondary | ICD-10-CM | POA: Diagnosis not present

## 2017-07-30 DIAGNOSIS — G894 Chronic pain syndrome: Secondary | ICD-10-CM | POA: Diagnosis not present

## 2017-07-30 DIAGNOSIS — J309 Allergic rhinitis, unspecified: Secondary | ICD-10-CM | POA: Diagnosis not present

## 2017-07-30 DIAGNOSIS — J449 Chronic obstructive pulmonary disease, unspecified: Secondary | ICD-10-CM | POA: Diagnosis not present

## 2017-07-30 DIAGNOSIS — M48061 Spinal stenosis, lumbar region without neurogenic claudication: Secondary | ICD-10-CM | POA: Diagnosis not present

## 2017-10-29 DIAGNOSIS — Z6823 Body mass index (BMI) 23.0-23.9, adult: Secondary | ICD-10-CM | POA: Diagnosis not present

## 2017-10-29 DIAGNOSIS — M810 Age-related osteoporosis without current pathological fracture: Secondary | ICD-10-CM | POA: Diagnosis not present

## 2017-10-29 DIAGNOSIS — M48061 Spinal stenosis, lumbar region without neurogenic claudication: Secondary | ICD-10-CM | POA: Diagnosis not present

## 2017-10-29 DIAGNOSIS — J449 Chronic obstructive pulmonary disease, unspecified: Secondary | ICD-10-CM | POA: Diagnosis not present

## 2017-10-29 DIAGNOSIS — J329 Chronic sinusitis, unspecified: Secondary | ICD-10-CM | POA: Diagnosis not present

## 2017-10-29 DIAGNOSIS — G894 Chronic pain syndrome: Secondary | ICD-10-CM | POA: Diagnosis not present

## 2017-10-29 DIAGNOSIS — Z0001 Encounter for general adult medical examination with abnormal findings: Secondary | ICD-10-CM | POA: Diagnosis not present

## 2017-10-29 DIAGNOSIS — K219 Gastro-esophageal reflux disease without esophagitis: Secondary | ICD-10-CM | POA: Diagnosis not present

## 2017-10-29 DIAGNOSIS — J309 Allergic rhinitis, unspecified: Secondary | ICD-10-CM | POA: Diagnosis not present

## 2017-10-30 DIAGNOSIS — Z1389 Encounter for screening for other disorder: Secondary | ICD-10-CM | POA: Diagnosis not present

## 2017-10-30 DIAGNOSIS — Z6823 Body mass index (BMI) 23.0-23.9, adult: Secondary | ICD-10-CM | POA: Diagnosis not present

## 2017-10-30 DIAGNOSIS — Z0001 Encounter for general adult medical examination with abnormal findings: Secondary | ICD-10-CM | POA: Diagnosis not present

## 2017-10-30 DIAGNOSIS — Z0000 Encounter for general adult medical examination without abnormal findings: Secondary | ICD-10-CM | POA: Diagnosis not present

## 2017-11-05 DIAGNOSIS — Z1211 Encounter for screening for malignant neoplasm of colon: Secondary | ICD-10-CM | POA: Diagnosis not present

## 2018-06-29 DIAGNOSIS — J449 Chronic obstructive pulmonary disease, unspecified: Secondary | ICD-10-CM | POA: Diagnosis not present

## 2018-06-29 DIAGNOSIS — F419 Anxiety disorder, unspecified: Secondary | ICD-10-CM | POA: Diagnosis not present

## 2018-06-29 DIAGNOSIS — J329 Chronic sinusitis, unspecified: Secondary | ICD-10-CM | POA: Diagnosis not present

## 2018-06-29 DIAGNOSIS — Z1389 Encounter for screening for other disorder: Secondary | ICD-10-CM | POA: Diagnosis not present

## 2018-06-29 DIAGNOSIS — Z6824 Body mass index (BMI) 24.0-24.9, adult: Secondary | ICD-10-CM | POA: Diagnosis not present

## 2018-06-29 DIAGNOSIS — J9801 Acute bronchospasm: Secondary | ICD-10-CM | POA: Diagnosis not present

## 2018-06-30 ENCOUNTER — Emergency Department (HOSPITAL_COMMUNITY)
Admission: EM | Admit: 2018-06-30 | Discharge: 2018-06-30 | Disposition: A | Discharge status: Home / Self Care | Payer: PPO | Attending: Emergency Medicine | Admitting: Emergency Medicine

## 2018-06-30 ENCOUNTER — Emergency Department (HOSPITAL_COMMUNITY): Payer: PPO

## 2018-06-30 ENCOUNTER — Other Ambulatory Visit: Payer: Self-pay

## 2018-06-30 ENCOUNTER — Encounter (HOSPITAL_COMMUNITY): Payer: Self-pay | Admitting: Emergency Medicine

## 2018-06-30 DIAGNOSIS — J101 Influenza due to other identified influenza virus with other respiratory manifestations: Secondary | ICD-10-CM | POA: Diagnosis not present

## 2018-06-30 DIAGNOSIS — Z79899 Other long term (current) drug therapy: Secondary | ICD-10-CM | POA: Insufficient documentation

## 2018-06-30 DIAGNOSIS — R05 Cough: Secondary | ICD-10-CM | POA: Diagnosis not present

## 2018-06-30 DIAGNOSIS — J449 Chronic obstructive pulmonary disease, unspecified: Secondary | ICD-10-CM | POA: Insufficient documentation

## 2018-06-30 DIAGNOSIS — F17210 Nicotine dependence, cigarettes, uncomplicated: Secondary | ICD-10-CM | POA: Diagnosis not present

## 2018-06-30 DIAGNOSIS — R0602 Shortness of breath: Secondary | ICD-10-CM | POA: Diagnosis not present

## 2018-06-30 LAB — INFLUENZA PANEL BY PCR (TYPE A & B)
Influenza A By PCR: POSITIVE — AB
Influenza B By PCR: NEGATIVE

## 2018-06-30 MED ORDER — OSELTAMIVIR PHOSPHATE 75 MG PO CAPS: 75.0000 mg | ORAL_CAPSULE | Freq: Two times a day (BID) | ORAL | 0 refills | Status: DC

## 2018-06-30 MED ORDER — OSELTAMIVIR PHOSPHATE 75 MG PO CAPS
75.0000 mg | ORAL_CAPSULE | Freq: Once | ORAL | Status: AC
  Administered 2018-06-30: 75 mg via ORAL
  Filled 2018-06-30: qty 1

## 2018-06-30 MED ORDER — ALBUTEROL (5 MG/ML) CONTINUOUS INHALATION SOLN
10.0000 mg/h | INHALATION_SOLUTION | Freq: Once | RESPIRATORY_TRACT | Status: AC
  Administered 2018-06-30: 10 mg/h via RESPIRATORY_TRACT
  Filled 2018-06-30: qty 20

## 2018-06-30 NOTE — ED Notes (Signed)
Pt completed neb tx. Removed mask from pt.

## 2018-06-30 NOTE — Discharge Instructions (Addendum)
Drink plenty of fluids.  You will need to continue taking acetaminophen for fever for the next couple days until the flu is better.  Continue using your inhaler for your wheezing.  Take Mucinex DM over-the-counter for your cough.  Recheck if you get dehydrated, or you struggle to breathe or seem worse. Keeping taking amoxicillin until finished. Your chest x-ray doesn't look like pneumonia though.

## 2018-06-30 NOTE — ED Triage Notes (Signed)
Pt C/O congestion and fever since yesterday. Pt saw PCP and was placed on amoxacillin yesterday. Pt states she has taken 500mg  Tylenol every 4 hours for fever with no relief.
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
## 2018-07-08 ENCOUNTER — Emergency Department (HOSPITAL_COMMUNITY)
Admission: EM | Admit: 2018-07-08 | Discharge: 2018-07-08 | Disposition: A | Discharge status: Home / Self Care | Payer: PPO | Attending: Emergency Medicine | Admitting: Emergency Medicine

## 2018-07-08 ENCOUNTER — Other Ambulatory Visit: Payer: Self-pay

## 2018-07-08 ENCOUNTER — Encounter (HOSPITAL_COMMUNITY): Payer: Self-pay | Admitting: *Deleted

## 2018-07-08 ENCOUNTER — Emergency Department (HOSPITAL_COMMUNITY): Payer: PPO

## 2018-07-08 DIAGNOSIS — J449 Chronic obstructive pulmonary disease, unspecified: Secondary | ICD-10-CM | POA: Diagnosis not present

## 2018-07-08 DIAGNOSIS — E871 Hypo-osmolality and hyponatremia: Secondary | ICD-10-CM | POA: Diagnosis not present

## 2018-07-08 DIAGNOSIS — J101 Influenza due to other identified influenza virus with other respiratory manifestations: Secondary | ICD-10-CM | POA: Diagnosis not present

## 2018-07-08 DIAGNOSIS — Z87891 Personal history of nicotine dependence: Secondary | ICD-10-CM | POA: Diagnosis not present

## 2018-07-08 DIAGNOSIS — R0602 Shortness of breath: Secondary | ICD-10-CM | POA: Diagnosis not present

## 2018-07-08 DIAGNOSIS — Z79899 Other long term (current) drug therapy: Secondary | ICD-10-CM | POA: Diagnosis not present

## 2018-07-08 DIAGNOSIS — Z6824 Body mass index (BMI) 24.0-24.9, adult: Secondary | ICD-10-CM | POA: Diagnosis not present

## 2018-07-08 DIAGNOSIS — J09X2 Influenza due to identified novel influenza A virus with other respiratory manifestations: Secondary | ICD-10-CM | POA: Diagnosis not present

## 2018-07-08 LAB — COMPREHENSIVE METABOLIC PANEL
ALT: 34 U/L (ref 0–44)
AST: 20 U/L (ref 15–41)
Albumin: 4.1 g/dL (ref 3.5–5.0)
Alkaline Phosphatase: 75 U/L (ref 38–126)
Anion gap: 12 (ref 5–15)
BUN: 11 mg/dL (ref 8–23)
CHLORIDE: 91 mmol/L — AB (ref 98–111)
CO2: 24 mmol/L (ref 22–32)
Calcium: 9.3 mg/dL (ref 8.9–10.3)
Creatinine, Ser: 0.54 mg/dL (ref 0.44–1.00)
GFR calc Af Amer: >60 mL/min (ref >60)
GFR calc non Af Amer: >60 mL/min (ref >60)
Glucose, Bld: 109 mg/dL — ABNORMAL HIGH (ref 70–99)
Potassium: 4.1 mmol/L (ref 3.5–5.1)
Sodium: 127 mmol/L — ABNORMAL LOW (ref 135–145)
Total Bilirubin: 1.1 mg/dL (ref 0.3–1.2)
Total Protein: 7.9 g/dL (ref 6.5–8.1)

## 2018-07-08 LAB — CBC WITH DIFFERENTIAL/PLATELET
Abs Immature Granulocytes: 0.07 10*3/uL (ref 0.00–0.07)
Basophils Absolute: 0 10*3/uL (ref 0.0–0.1)
Basophils Relative: 0 %
Eosinophils Absolute: 0 10*3/uL (ref 0.0–0.5)
Eosinophils Relative: 0 %
HCT: 42.5 % (ref 36.0–46.0)
Hemoglobin: 14.9 g/dL (ref 12.0–15.0)
Immature Granulocytes: 1 %
LYMPHS ABS: 1.1 10*3/uL (ref 0.7–4.0)
Lymphocytes Relative: 9 %
MCH: 32.3 pg (ref 26.0–34.0)
MCHC: 35.1 g/dL (ref 30.0–36.0)
MCV: 92.2 fL (ref 80.0–100.0)
Monocytes Absolute: 0.8 10*3/uL (ref 0.1–1.0)
Monocytes Relative: 7 %
NRBC: 0 % (ref 0.0–0.2)
Neutro Abs: 9.8 10*3/uL — ABNORMAL HIGH (ref 1.7–7.7)
Neutrophils Relative %: 83 %
Platelets: 278 10*3/uL (ref 150–400)
RBC: 4.61 MIL/uL (ref 3.87–5.11)
RDW: 12.4 % (ref 11.5–15.5)
WBC: 11.8 10*3/uL — AB (ref 4.0–10.5)

## 2018-07-08 MED ORDER — IPRATROPIUM-ALBUTEROL 0.5-2.5 (3) MG/3ML IN SOLN
3.0000 mL | Freq: Once | RESPIRATORY_TRACT | Status: AC
  Administered 2018-07-08: 3 mL via RESPIRATORY_TRACT
  Filled 2018-07-08: qty 3

## 2018-07-08 MED ORDER — ACETAMINOPHEN 500 MG PO TABS: 1000.0000 mg | ORAL_TABLET | Freq: Once | ORAL | Status: DC

## 2018-07-08 MED ORDER — SODIUM CHLORIDE 0.9 % IV BOLUS
500.0000 mL | Freq: Once | INTRAVENOUS | Status: AC
  Administered 2018-07-08: 500 mL via INTRAVENOUS

## 2018-07-08 MED ORDER — SODIUM CHLORIDE 0.9 % IV SOLN: INTRAVENOUS | Status: DC

## 2018-07-08 NOTE — Discharge Instructions (Addendum)
Chest x-ray negative for pneumonia.  Continue your current medication.  Oxygen levels were fine here.  Sodium was a little low received some IV normal saline to help correct that.  Follow-up with your primary care doctor for recheck her sodium in a week.  Return for any new or worse symptoms.  Based on the duration of your symptoms being now with onset Monday a week ago would expect some improvement.  Over the next several days.  Would expect your flu to slowly start to improve over the next several days.  No signs of any worsening findings on the chest x-ray today.

## 2018-07-08 NOTE — ED Notes (Signed)
Patient transported to X-ray 

## 2018-07-08 NOTE — ED Notes (Signed)
Family at bedside. 

## 2018-07-08 NOTE — ED Triage Notes (Signed)
Pt c/o SOB, cough, fever since last Tuesday. Pt saw her PCP last Tuesday and was placed on antibiotics and then was in the ER the next day and told she had the flu and was started on Tamiflu. Pt reports she hasn't gotten any better and has only gotten worse. Pt last took Tylenol 500mg  at 0600 this morning.
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
## 2018-07-27 DIAGNOSIS — M1990 Unspecified osteoarthritis, unspecified site: Secondary | ICD-10-CM | POA: Diagnosis not present

## 2018-07-27 DIAGNOSIS — K5900 Constipation, unspecified: Secondary | ICD-10-CM | POA: Diagnosis not present

## 2018-07-27 DIAGNOSIS — J439 Emphysema, unspecified: Secondary | ICD-10-CM | POA: Diagnosis not present

## 2018-07-27 DIAGNOSIS — I129 Hypertensive chronic kidney disease with stage 1 through stage 4 chronic kidney disease, or unspecified chronic kidney disease: Secondary | ICD-10-CM | POA: Diagnosis not present

## 2018-07-27 DIAGNOSIS — E119 Type 2 diabetes mellitus without complications: Secondary | ICD-10-CM | POA: Diagnosis not present

## 2018-07-27 DIAGNOSIS — Z9181 History of falling: Secondary | ICD-10-CM | POA: Diagnosis not present

## 2018-07-27 DIAGNOSIS — N183 Chronic kidney disease, stage 3 unspecified: Secondary | ICD-10-CM | POA: Diagnosis not present

## 2018-07-27 DIAGNOSIS — G4733 Obstructive sleep apnea (adult) (pediatric): Secondary | ICD-10-CM | POA: Diagnosis not present

## 2018-07-27 DIAGNOSIS — Z8249 Family history of ischemic heart disease and other diseases of the circulatory system: Secondary | ICD-10-CM | POA: Diagnosis not present

## 2018-07-27 DIAGNOSIS — Z681 Body mass index (BMI) 19 or less, adult: Secondary | ICD-10-CM | POA: Diagnosis not present

## 2018-07-27 DIAGNOSIS — G8929 Other chronic pain: Secondary | ICD-10-CM | POA: Diagnosis not present

## 2018-07-27 DIAGNOSIS — G2581 Restless legs syndrome: Secondary | ICD-10-CM | POA: Diagnosis not present

## 2018-07-27 DIAGNOSIS — I4891 Unspecified atrial fibrillation: Secondary | ICD-10-CM | POA: Diagnosis not present

## 2018-07-27 DIAGNOSIS — Z96651 Presence of right artificial knee joint: Secondary | ICD-10-CM | POA: Diagnosis not present

## 2018-07-27 DIAGNOSIS — Z7984 Long term (current) use of oral hypoglycemic drugs: Secondary | ICD-10-CM | POA: Diagnosis not present

## 2018-07-27 DIAGNOSIS — Z0001 Encounter for general adult medical examination with abnormal findings: Secondary | ICD-10-CM | POA: Diagnosis not present

## 2018-07-27 DIAGNOSIS — M170 Bilateral primary osteoarthritis of knee: Secondary | ICD-10-CM | POA: Diagnosis not present

## 2018-07-27 DIAGNOSIS — Z96653 Presence of artificial knee joint, bilateral: Secondary | ICD-10-CM | POA: Diagnosis not present

## 2018-07-27 DIAGNOSIS — Z1389 Encounter for screening for other disorder: Secondary | ICD-10-CM | POA: Diagnosis not present

## 2018-07-27 DIAGNOSIS — Z7982 Long term (current) use of aspirin: Secondary | ICD-10-CM | POA: Diagnosis not present

## 2018-07-27 DIAGNOSIS — R296 Repeated falls: Secondary | ICD-10-CM | POA: Diagnosis not present

## 2018-07-27 DIAGNOSIS — K219 Gastro-esophageal reflux disease without esophagitis: Secondary | ICD-10-CM | POA: Diagnosis not present

## 2018-07-27 DIAGNOSIS — G894 Chronic pain syndrome: Secondary | ICD-10-CM | POA: Diagnosis not present

## 2018-07-27 DIAGNOSIS — Z79899 Other long term (current) drug therapy: Secondary | ICD-10-CM | POA: Diagnosis not present

## 2018-07-27 DIAGNOSIS — S0003XA Contusion of scalp, initial encounter: Secondary | ICD-10-CM | POA: Diagnosis not present

## 2018-07-27 DIAGNOSIS — M545 Low back pain: Secondary | ICD-10-CM | POA: Diagnosis not present

## 2018-07-27 DIAGNOSIS — L89152 Pressure ulcer of sacral region, stage 2: Secondary | ICD-10-CM | POA: Diagnosis not present

## 2018-07-27 DIAGNOSIS — F0150 Vascular dementia without behavioral disturbance: Secondary | ICD-10-CM | POA: Diagnosis not present

## 2018-07-27 DIAGNOSIS — I2510 Atherosclerotic heart disease of native coronary artery without angina pectoris: Secondary | ICD-10-CM | POA: Diagnosis not present

## 2018-07-27 DIAGNOSIS — I70211 Atherosclerosis of native arteries of extremities with intermittent claudication, right leg: Secondary | ICD-10-CM | POA: Diagnosis not present

## 2018-07-27 DIAGNOSIS — M810 Age-related osteoporosis without current pathological fracture: Secondary | ICD-10-CM | POA: Diagnosis not present

## 2018-07-27 DIAGNOSIS — J449 Chronic obstructive pulmonary disease, unspecified: Secondary | ICD-10-CM | POA: Diagnosis not present

## 2018-07-27 DIAGNOSIS — Z87891 Personal history of nicotine dependence: Secondary | ICD-10-CM | POA: Diagnosis not present

## 2018-07-27 DIAGNOSIS — M7701 Medial epicondylitis, right elbow: Secondary | ICD-10-CM | POA: Diagnosis not present

## 2018-07-27 DIAGNOSIS — I10 Essential (primary) hypertension: Secondary | ICD-10-CM | POA: Diagnosis not present

## 2018-07-27 DIAGNOSIS — I679 Cerebrovascular disease, unspecified: Secondary | ICD-10-CM | POA: Diagnosis not present

## 2018-07-27 DIAGNOSIS — R9431 Abnormal electrocardiogram [ECG] [EKG]: Secondary | ICD-10-CM | POA: Diagnosis not present

## 2018-07-27 DIAGNOSIS — E785 Hyperlipidemia, unspecified: Secondary | ICD-10-CM | POA: Diagnosis not present

## 2018-11-15 DIAGNOSIS — Z1211 Encounter for screening for malignant neoplasm of colon: Secondary | ICD-10-CM | POA: Diagnosis not present

## 2018-11-18 IMAGING — DX DG CHEST 2V
2 series · 2 of 2 positions shown · non-contrast
Comparison: Chest radiograph 08/17/2016

CLINICAL DATA: Shortness of breath and congestion

EXAM:
CHEST - 2 VIEW

[chest pa]
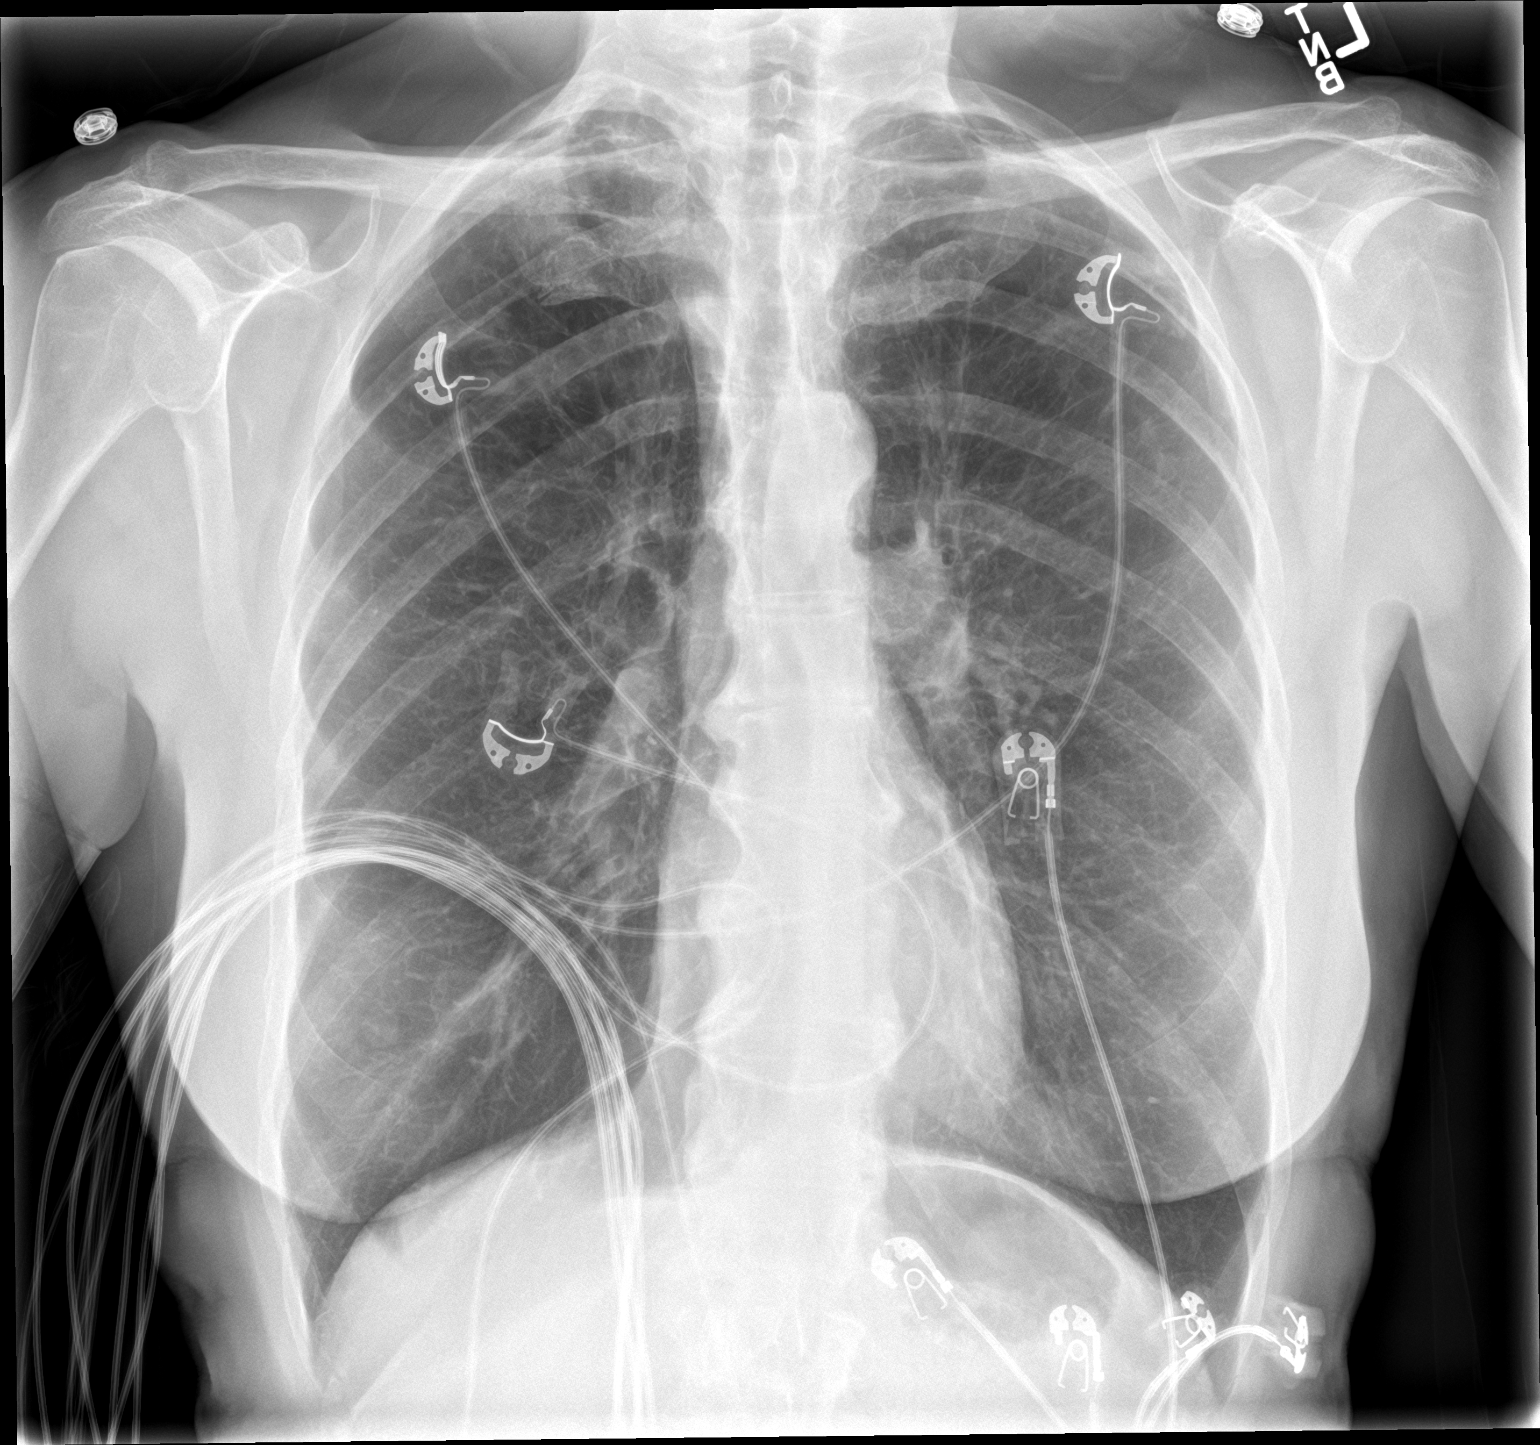

[chest lat]
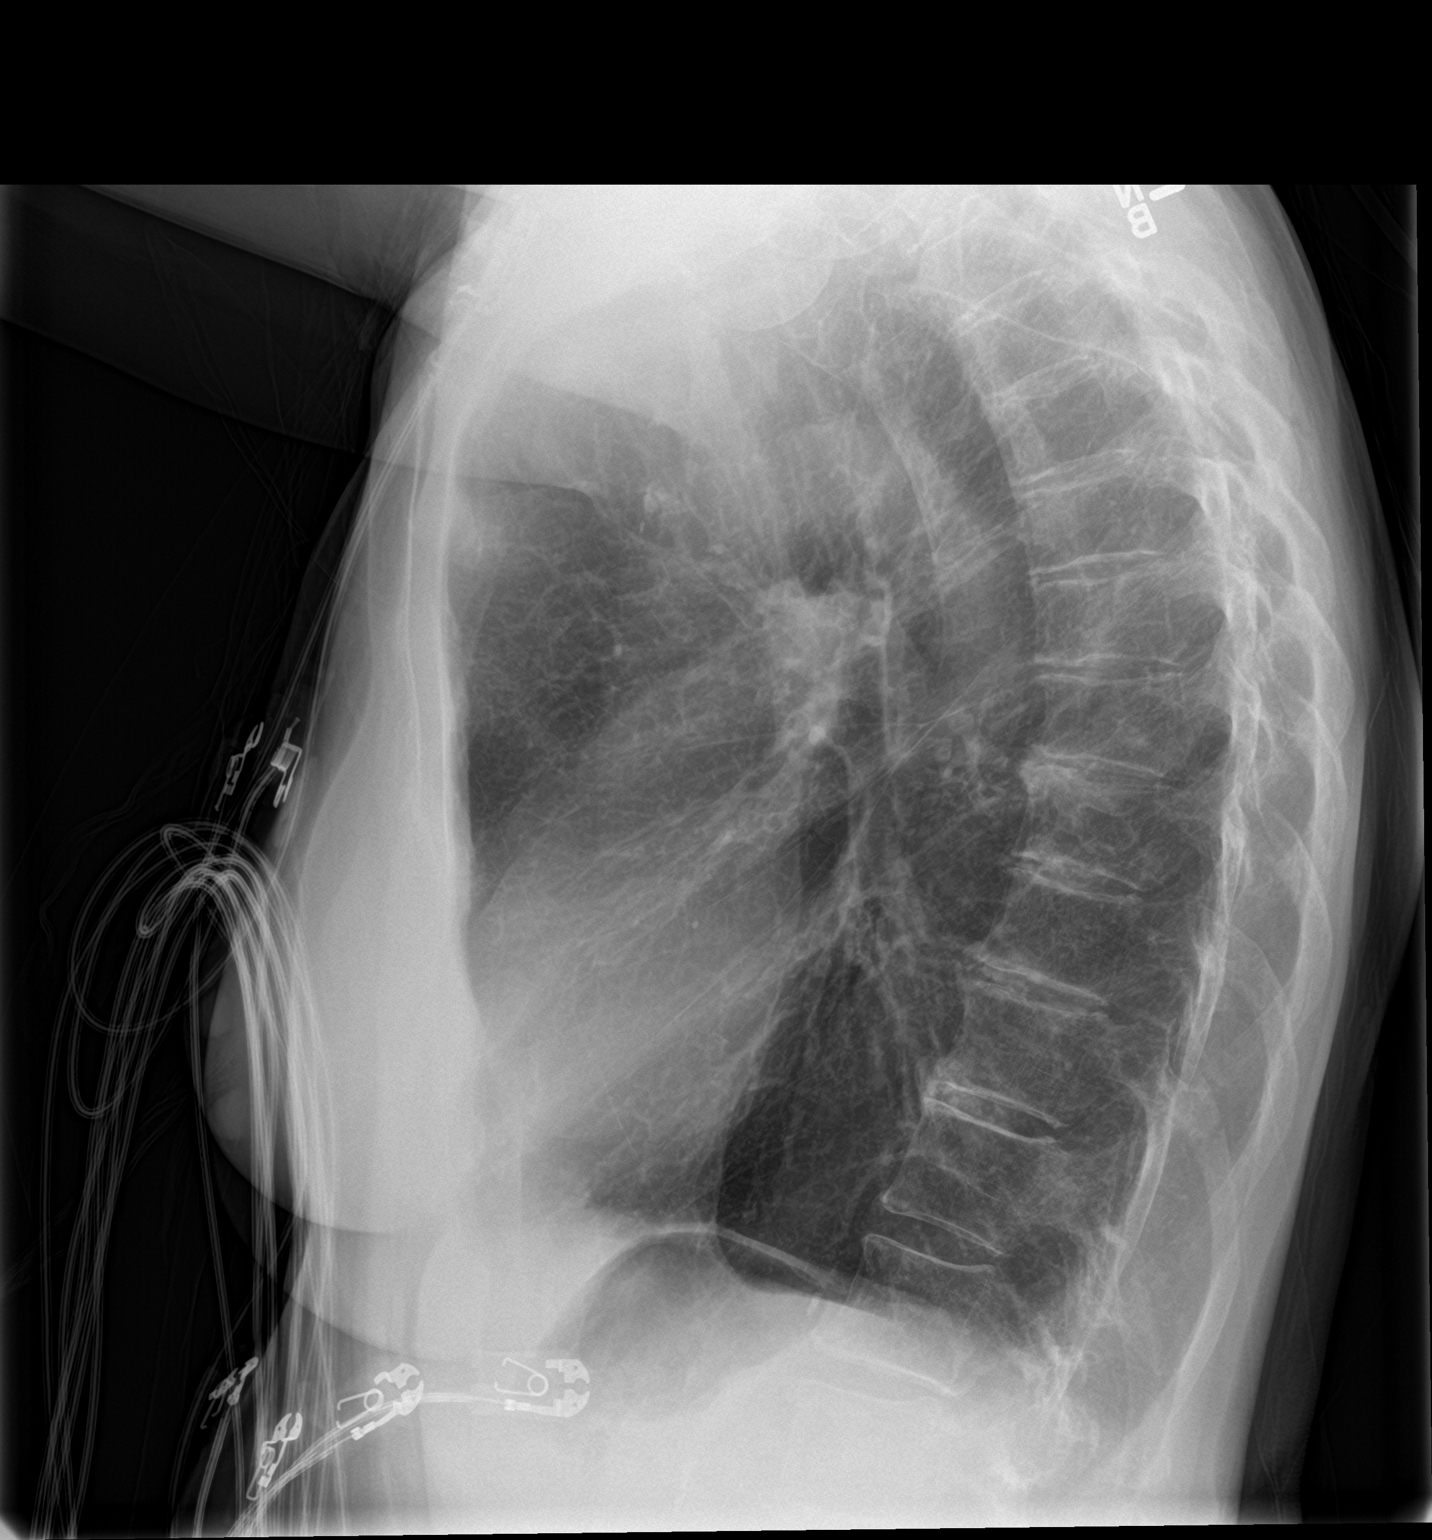

[2 of 2 positions shown; findings below may reference images not displayed]

FINDINGS: The lungs are hyperinflated. No focal airspace consolidation or
pulmonary edema. No pleural effusion or pneumothorax.
IMPRESSION: COPD without acute airspace disease.

## 2019-02-03 ENCOUNTER — Other Ambulatory Visit: Payer: Self-pay

## 2019-02-03 DIAGNOSIS — Z20822 Contact with and (suspected) exposure to covid-19: Secondary | ICD-10-CM

## 2019-02-03 DIAGNOSIS — Z20828 Contact with and (suspected) exposure to other viral communicable diseases: Secondary | ICD-10-CM | POA: Diagnosis not present

## 2019-02-05 LAB — NOVEL CORONAVIRUS, NAA: SARS-CoV-2, NAA: NOT DETECTED

## 2019-02-08 ENCOUNTER — Telehealth: Payer: Self-pay | Admitting: General Practice

## 2019-02-08 NOTE — Telephone Encounter (Signed)
Negative COVID results given. Patient results "NOT Detected." Caller expressed understanding. ° °

## 2019-07-20 DIAGNOSIS — G894 Chronic pain syndrome: Secondary | ICD-10-CM | POA: Diagnosis not present

## 2019-07-20 DIAGNOSIS — J449 Chronic obstructive pulmonary disease, unspecified: Secondary | ICD-10-CM | POA: Diagnosis not present

## 2019-07-20 DIAGNOSIS — Z87891 Personal history of nicotine dependence: Secondary | ICD-10-CM | POA: Diagnosis not present

## 2019-07-20 DIAGNOSIS — M810 Age-related osteoporosis without current pathological fracture: Secondary | ICD-10-CM | POA: Diagnosis not present

## 2019-08-08 ENCOUNTER — Other Ambulatory Visit: Payer: Self-pay

## 2019-08-08 ENCOUNTER — Other Ambulatory Visit (HOSPITAL_COMMUNITY): Payer: Self-pay | Admitting: Internal Medicine

## 2019-08-08 ENCOUNTER — Ambulatory Visit (HOSPITAL_COMMUNITY)
Admission: RE | Admit: 2019-08-08 | Discharge: 2019-08-08 | Disposition: A | Discharge status: Home / Self Care | Payer: PPO | Source: Ambulatory Visit | Attending: Internal Medicine | Admitting: Internal Medicine

## 2019-08-08 ENCOUNTER — Encounter (HOSPITAL_COMMUNITY): Payer: Self-pay

## 2019-08-08 DIAGNOSIS — R0600 Dyspnea, unspecified: Secondary | ICD-10-CM

## 2019-08-08 DIAGNOSIS — Z6824 Body mass index (BMI) 24.0-24.9, adult: Secondary | ICD-10-CM | POA: Diagnosis not present

## 2019-08-08 DIAGNOSIS — J449 Chronic obstructive pulmonary disease, unspecified: Secondary | ICD-10-CM | POA: Diagnosis not present

## 2019-08-08 DIAGNOSIS — J209 Acute bronchitis, unspecified: Secondary | ICD-10-CM | POA: Diagnosis not present

## 2019-08-08 DIAGNOSIS — Z0001 Encounter for general adult medical examination with abnormal findings: Secondary | ICD-10-CM | POA: Diagnosis not present

## 2019-08-08 DIAGNOSIS — K219 Gastro-esophageal reflux disease without esophagitis: Secondary | ICD-10-CM | POA: Diagnosis not present

## 2019-08-08 DIAGNOSIS — J439 Emphysema, unspecified: Secondary | ICD-10-CM | POA: Diagnosis not present

## 2019-08-08 DIAGNOSIS — G894 Chronic pain syndrome: Secondary | ICD-10-CM | POA: Diagnosis not present

## 2019-08-08 DIAGNOSIS — M810 Age-related osteoporosis without current pathological fracture: Secondary | ICD-10-CM | POA: Diagnosis not present

## 2019-08-08 DIAGNOSIS — Z1389 Encounter for screening for other disorder: Secondary | ICD-10-CM | POA: Diagnosis not present

## 2019-08-08 DIAGNOSIS — J309 Allergic rhinitis, unspecified: Secondary | ICD-10-CM | POA: Diagnosis not present

## 2019-08-08 DIAGNOSIS — M48061 Spinal stenosis, lumbar region without neurogenic claudication: Secondary | ICD-10-CM | POA: Diagnosis not present

## 2019-09-08 ENCOUNTER — Inpatient Hospital Stay (HOSPITAL_COMMUNITY): Payer: PPO

## 2019-09-08 ENCOUNTER — Other Ambulatory Visit: Payer: Self-pay

## 2019-09-08 ENCOUNTER — Inpatient Hospital Stay (HOSPITAL_COMMUNITY)
Admission: EM | Admit: 2019-09-08 | Discharge: 2019-09-10 | DRG: 190 | Disposition: A | Discharge status: Home / Self Care | Payer: PPO | Attending: Internal Medicine | Admitting: Internal Medicine

## 2019-09-08 ENCOUNTER — Emergency Department (HOSPITAL_COMMUNITY): Payer: PPO

## 2019-09-08 ENCOUNTER — Encounter (HOSPITAL_COMMUNITY): Payer: Self-pay | Admitting: Emergency Medicine

## 2019-09-08 DIAGNOSIS — J9601 Acute respiratory failure with hypoxia: Secondary | ICD-10-CM | POA: Diagnosis not present

## 2019-09-08 DIAGNOSIS — I471 Supraventricular tachycardia: Secondary | ICD-10-CM

## 2019-09-08 DIAGNOSIS — J441 Chronic obstructive pulmonary disease with (acute) exacerbation: Principal | ICD-10-CM

## 2019-09-08 DIAGNOSIS — F17210 Nicotine dependence, cigarettes, uncomplicated: Secondary | ICD-10-CM | POA: Diagnosis not present

## 2019-09-08 DIAGNOSIS — K219 Gastro-esophageal reflux disease without esophagitis: Secondary | ICD-10-CM | POA: Diagnosis present

## 2019-09-08 DIAGNOSIS — I4719 Other supraventricular tachycardia: Secondary | ICD-10-CM

## 2019-09-08 DIAGNOSIS — Z7951 Long term (current) use of inhaled steroids: Secondary | ICD-10-CM | POA: Diagnosis not present

## 2019-09-08 DIAGNOSIS — R0602 Shortness of breath: Secondary | ICD-10-CM | POA: Diagnosis not present

## 2019-09-08 DIAGNOSIS — Z20822 Contact with and (suspected) exposure to covid-19: Secondary | ICD-10-CM | POA: Diagnosis present

## 2019-09-08 LAB — CBC WITH DIFFERENTIAL/PLATELET
Abs Immature Granulocytes: 0.01 10*3/uL (ref 0.00–0.07)
Basophils Absolute: 0 10*3/uL (ref 0.0–0.1)
Basophils Relative: 1 %
Eosinophils Absolute: 0.8 10*3/uL — ABNORMAL HIGH (ref 0.0–0.5)
Eosinophils Relative: 10 %
HCT: 44.7 % (ref 36.0–46.0)
Hemoglobin: 15.3 g/dL — ABNORMAL HIGH (ref 12.0–15.0)
Immature Granulocytes: 0 %
Lymphocytes Relative: 24 %
Lymphs Abs: 1.8 10*3/uL (ref 0.7–4.0)
MCH: 32.6 pg (ref 26.0–34.0)
MCHC: 34.2 g/dL (ref 30.0–36.0)
MCV: 95.3 fL (ref 80.0–100.0)
Monocytes Absolute: 0.7 10*3/uL (ref 0.1–1.0)
Monocytes Relative: 9 %
Neutro Abs: 4.1 10*3/uL (ref 1.7–7.7)
Neutrophils Relative %: 56 %
Platelets: 272 10*3/uL (ref 150–400)
RBC: 4.69 MIL/uL (ref 3.87–5.11)
RDW: 12.1 % (ref 11.5–15.5)
WBC: 7.4 10*3/uL (ref 4.0–10.5)
nRBC: 0 % (ref 0.0–0.2)

## 2019-09-08 LAB — COMPREHENSIVE METABOLIC PANEL
ALT: 19 U/L (ref 0–44)
AST: 18 U/L (ref 15–41)
Albumin: 4.2 g/dL (ref 3.5–5.0)
Alkaline Phosphatase: 82 U/L (ref 38–126)
Anion gap: 10 (ref 5–15)
BUN: 9 mg/dL (ref 8–23)
CO2: 26 mmol/L (ref 22–32)
Calcium: 9.5 mg/dL (ref 8.9–10.3)
Chloride: 98 mmol/L (ref 98–111)
Creatinine, Ser: 0.7 mg/dL (ref 0.44–1.00)
GFR calc Af Amer: >60 mL/min (ref >60)
GFR calc non Af Amer: >60 mL/min (ref >60)
Glucose, Bld: 121 mg/dL — ABNORMAL HIGH (ref 70–99)
Potassium: 4.1 mmol/L (ref 3.5–5.1)
Sodium: 134 mmol/L — ABNORMAL LOW (ref 135–145)
Total Bilirubin: 1 mg/dL (ref 0.3–1.2)
Total Protein: 7.5 g/dL (ref 6.5–8.1)

## 2019-09-08 LAB — TROPONIN I (HIGH SENSITIVITY)
Troponin I (High Sensitivity): 3 ng/L (ref <18)
Troponin I (High Sensitivity): 4 ng/L (ref <18)

## 2019-09-08 LAB — BRAIN NATRIURETIC PEPTIDE: B Natriuretic Peptide: 19 pg/mL (ref 0.0–100.0)

## 2019-09-08 LAB — ECHOCARDIOGRAM COMPLETE
Height: 65 in
Weight: 2224 oz

## 2019-09-08 LAB — LACTIC ACID, PLASMA: Lactic Acid, Venous: 1.1 mmol/L (ref 0.5–1.9)

## 2019-09-08 LAB — SARS CORONAVIRUS 2 BY RT PCR (HOSPITAL ORDER, PERFORMED IN ~~LOC~~ HOSPITAL LAB): SARS Coronavirus 2: NEGATIVE

## 2019-09-08 LAB — MRSA PCR SCREENING: MRSA by PCR: NEGATIVE

## 2019-09-08 MED ORDER — ACETAMINOPHEN 650 MG RE SUPP: 650.0000 mg | Freq: Four times a day (QID) | RECTAL | Status: DC | PRN

## 2019-09-08 MED ORDER — PANTOPRAZOLE SODIUM 40 MG PO TBEC
40.0000 mg | DELAYED_RELEASE_TABLET | Freq: Every day | ORAL | Status: DC
  Administered 2019-09-08 – 2019-09-10 (×3): 40 mg via ORAL
  Filled 2019-09-08 (×3): qty 1

## 2019-09-08 MED ORDER — ACETAMINOPHEN 325 MG PO TABS: 650.0000 mg | ORAL_TABLET | Freq: Four times a day (QID) | ORAL | Status: DC | PRN

## 2019-09-08 MED ORDER — METHYLPREDNISOLONE SODIUM SUCC 125 MG IJ SOLR
125.0000 mg | Freq: Once | INTRAMUSCULAR | Status: AC
  Administered 2019-09-08: 125 mg via INTRAVENOUS
  Filled 2019-09-08: qty 2

## 2019-09-08 MED ORDER — CHLORHEXIDINE GLUCONATE CLOTH 2 % EX PADS
6.0000 | MEDICATED_PAD | Freq: Every day | CUTANEOUS | Status: DC
  Administered 2019-09-08 – 2019-09-10 (×2): 6 via TOPICAL

## 2019-09-08 MED ORDER — DILTIAZEM HCL-DEXTROSE 125-5 MG/125ML-% IV SOLN (PREMIX)
5.0000 mg/h | INTRAVENOUS | Status: DC
  Administered 2019-09-08 – 2019-09-09 (×2): 5 mg/h via INTRAVENOUS
  Filled 2019-09-08 (×2): qty 125

## 2019-09-08 MED ORDER — ALBUTEROL SULFATE HFA 108 (90 BASE) MCG/ACT IN AERS
8.0000 | INHALATION_SPRAY | Freq: Once | RESPIRATORY_TRACT | Status: AC
  Administered 2019-09-08: 8 via RESPIRATORY_TRACT
  Filled 2019-09-08: qty 6.7

## 2019-09-08 MED ORDER — ALBUTEROL SULFATE HFA 108 (90 BASE) MCG/ACT IN AERS
8.0000 | INHALATION_SPRAY | Freq: Once | RESPIRATORY_TRACT | Status: AC
  Administered 2019-09-08: 8 via RESPIRATORY_TRACT

## 2019-09-08 MED ORDER — BUDESONIDE 0.5 MG/2ML IN SUSP
0.5000 mg | Freq: Two times a day (BID) | RESPIRATORY_TRACT | Status: DC
  Administered 2019-09-08 – 2019-09-10 (×5): 0.5 mg via RESPIRATORY_TRACT
  Filled 2019-09-08 (×6): qty 2

## 2019-09-08 MED ORDER — ENOXAPARIN SODIUM 40 MG/0.4ML ~~LOC~~ SOLN
40.0000 mg | SUBCUTANEOUS | Status: DC
  Administered 2019-09-08 – 2019-09-09 (×2): 40 mg via SUBCUTANEOUS
  Filled 2019-09-08 (×2): qty 0.4

## 2019-09-08 MED ORDER — ONDANSETRON HCL 4 MG PO TABS: 4.0000 mg | ORAL_TABLET | Freq: Four times a day (QID) | ORAL | Status: DC | PRN

## 2019-09-08 MED ORDER — IPRATROPIUM-ALBUTEROL 0.5-2.5 (3) MG/3ML IN SOLN
3.0000 mL | Freq: Four times a day (QID) | RESPIRATORY_TRACT | Status: DC
  Administered 2019-09-08: 3 mL via RESPIRATORY_TRACT
  Filled 2019-09-08: qty 3

## 2019-09-08 MED ORDER — ONDANSETRON HCL 4 MG/2ML IJ SOLN: 4.0000 mg | Freq: Four times a day (QID) | INTRAMUSCULAR | Status: DC | PRN

## 2019-09-08 MED ORDER — METHYLPREDNISOLONE SODIUM SUCC 125 MG IJ SOLR
60.0000 mg | Freq: Three times a day (TID) | INTRAMUSCULAR | Status: AC
  Administered 2019-09-08 – 2019-09-09 (×4): 60 mg via INTRAVENOUS
  Filled 2019-09-08 (×4): qty 2

## 2019-09-08 MED ORDER — TEMAZEPAM 7.5 MG PO CAPS
7.5000 mg | ORAL_CAPSULE | Freq: Every evening | ORAL | Status: DC | PRN
  Administered 2019-09-08 – 2019-09-09 (×2): 7.5 mg via ORAL
  Filled 2019-09-08 (×2): qty 1

## 2019-09-08 MED ORDER — IPRATROPIUM-ALBUTEROL 0.5-2.5 (3) MG/3ML IN SOLN
3.0000 mL | Freq: Three times a day (TID) | RESPIRATORY_TRACT | Status: DC
  Administered 2019-09-08 – 2019-09-09 (×2): 3 mL via RESPIRATORY_TRACT
  Filled 2019-09-08 (×3): qty 3

## 2019-09-08 MED ORDER — MAGNESIUM SULFATE 2 GM/50ML IV SOLN
2.0000 g | Freq: Once | INTRAVENOUS | Status: AC
  Administered 2019-09-08: 2 g via INTRAVENOUS
  Filled 2019-09-08: qty 50

## 2019-09-08 MED ORDER — ALBUTEROL (5 MG/ML) CONTINUOUS INHALATION SOLN
10.0000 mg/h | INHALATION_SOLUTION | Freq: Once | RESPIRATORY_TRACT | Status: AC
  Administered 2019-09-08: 10 mg/h via RESPIRATORY_TRACT
  Filled 2019-09-08: qty 20

## 2019-09-08 NOTE — ED Provider Notes (Signed)
Patient just finished continuous nebulizer still wheezing.  Will require admission for COPD exacerbation.  Patient is already received steroids.  Patient also is having some side effects from the continuous neb.  Heart rate up into the 130s.  Will get EKG monitor looks like sinus tachycardia.  Patient also with some tremors secondary to it.  We will discuss with hospitalist regarding admission.  Covid testing was negative.   Vanetta Mulders, MD 09/08/19 [phone]

## 2019-09-08 NOTE — ED Notes (Signed)
Have notified Dr. Deretha Emory of patient's HR.
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
## 2019-09-08 NOTE — Progress Notes (Signed)
  Echocardiogram 2D Echocardiogram has been performed.  Abigail Evans 09/08/2019, 3:39 PM

## 2019-09-08 NOTE — ED Triage Notes (Signed)
Pt here for [facility], pt was seen by PCP back in April and was giving abx and zpack. Pt states that she has just been getting worse since then.

## 2019-09-08 NOTE — ED Notes (Signed)
Dr Tat at bedside 
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
## 2019-09-09 LAB — BASIC METABOLIC PANEL
Anion gap: 12 (ref 5–15)
BUN: 17 mg/dL (ref 8–23)
CO2: 24 mmol/L (ref 22–32)
Calcium: 9.1 mg/dL (ref 8.9–10.3)
Chloride: 92 mmol/L — ABNORMAL LOW (ref 98–111)
Creatinine, Ser: 0.84 mg/dL (ref 0.44–1.00)
GFR calc Af Amer: >60 mL/min (ref >60)
GFR calc non Af Amer: >60 mL/min (ref >60)
Glucose, Bld: 129 mg/dL — ABNORMAL HIGH (ref 70–99)
Potassium: 4.3 mmol/L (ref 3.5–5.1)
Sodium: 128 mmol/L — ABNORMAL LOW (ref 135–145)

## 2019-09-09 LAB — T4, FREE: Free T4: 1.16 ng/dL — ABNORMAL HIGH (ref 0.61–1.12)

## 2019-09-09 LAB — MAGNESIUM: Magnesium: 2.2 mg/dL (ref 1.7–2.4)

## 2019-09-09 LAB — CBC
HCT: 38.1 % (ref 36.0–46.0)
Hemoglobin: 12.8 g/dL (ref 12.0–15.0)
MCH: 31.8 pg (ref 26.0–34.0)
MCHC: 33.6 g/dL (ref 30.0–36.0)
MCV: 94.8 fL (ref 80.0–100.0)
Platelets: 234 10*3/uL (ref 150–400)
RBC: 4.02 MIL/uL (ref 3.87–5.11)
RDW: 12.1 % (ref 11.5–15.5)
WBC: 18.3 10*3/uL — ABNORMAL HIGH (ref 4.0–10.5)
nRBC: 0 % (ref 0.0–0.2)

## 2019-09-09 LAB — TSH: TSH: 0.131 u[IU]/mL — ABNORMAL LOW (ref 0.350–4.500)

## 2019-09-09 MED ORDER — SODIUM CHLORIDE 0.9 % IV SOLN: INTRAVENOUS | Status: DC

## 2019-09-09 MED ORDER — METOPROLOL TARTRATE 25 MG PO TABS
25.0000 mg | ORAL_TABLET | Freq: Two times a day (BID) | ORAL | Status: DC
  Administered 2019-09-09 – 2019-09-10 (×2): 25 mg via ORAL
  Filled 2019-09-09 (×2): qty 1

## 2019-09-09 MED ORDER — IPRATROPIUM BROMIDE 0.02 % IN SOLN
0.5000 mg | Freq: Two times a day (BID) | RESPIRATORY_TRACT | Status: DC
  Administered 2019-09-09 – 2019-09-10 (×2): 0.5 mg via RESPIRATORY_TRACT
  Filled 2019-09-09 (×2): qty 2.5

## 2019-09-09 MED ORDER — LEVALBUTEROL HCL 0.63 MG/3ML IN NEBU
0.6300 mg | INHALATION_SOLUTION | Freq: Two times a day (BID) | RESPIRATORY_TRACT | Status: DC
  Administered 2019-09-09 – 2019-09-10 (×2): 0.63 mg via RESPIRATORY_TRACT
  Filled 2019-09-09 (×2): qty 3

## 2019-09-09 MED ORDER — IPRATROPIUM-ALBUTEROL 0.5-2.5 (3) MG/3ML IN SOLN: 3.0000 mL | Freq: Two times a day (BID) | RESPIRATORY_TRACT | Status: DC

## 2019-09-09 MED ORDER — PREDNISONE 20 MG PO TABS
60.0000 mg | ORAL_TABLET | Freq: Every day | ORAL | Status: DC
  Administered 2019-09-10: 60 mg via ORAL
  Filled 2019-09-09: qty 3

## 2019-09-09 MED ORDER — METOPROLOL TARTRATE 25 MG PO TABS
12.5000 mg | ORAL_TABLET | Freq: Two times a day (BID) | ORAL | Status: DC
  Administered 2019-09-09: 12.5 mg via ORAL
  Filled 2019-09-09: qty 1

## 2019-09-09 NOTE — Care Management Important Message (Signed)
Important Message  Patient Details  Name: Abigail Evans MRN: [id number] Date of Birth: [birth year]   Medicare Important Message Given:  Yes     Corey Harold 09/09/2019, 11:45 AM

## 2019-09-09 NOTE — Progress Notes (Signed)
PROGRESS NOTE  [name] DOB: [birth year] DOA: 09/08/2019 PCP: Elfredia Nevins, MD  Brief History:  74 y.o. female with medical history of COPD, GERD presenting with about 1 month history of shortness of breath that has worsened over the past week.  The patient states that she went to see her primary care provider about 1 month ago.  She was given some type of shot and azithromycin.  She stated that it helped temporarily, but states that her shortness of breath and wheezing returned this past week.  She denied any fevers, chills, nausea, vomiting, diarrhea, abdominal pain.  She has some intermittent chest discomfort.  She denies any orthopnea or PND.  There is been no dysuria, hematuria, hematochezia, melena.  She quit smoking 1 year ago after approximately 25-pack-year history. In the emergency department, the patient was afebrile hemodynamically stable with oxygen saturation 93-97% room air.  WBC 7.4, hemoglobin 15.3, platelets 272,000.  BMP and LFTs were essentially unremarkable.  Lactic acid 1.1.  BNP 19.0.  Chest x-ray showed hyperinflation without any consolidations.  The patient was given albuterol with precipitation of SVT with heart rate in the 140s.  She was started on diltiazem drip and admitted to step down unit.  Assessment/Plan: COPD exacerbation -continue Pulmicort -continue duo nebs--switch to xopenex due to tachycardia -continue IV Solu-Medrol  SVT/MAT -Start diltiazem drip>>converted to sinus -now in sinus tach-->start low dose metoprolol -Echo--EF 65-70%, no WMA, mild elevated PASP -TSH 0.131 -Free T4 pending  GERD  -Continue PPI       Status is: Inpatient  Remains inpatient appropriate because:IV treatments appropriate due to intensity of illness or inability to take PO   Dispo: The patient is from: Home              Anticipated d/c is to: Home              Anticipated d/c date is: 1 day              Patient currently is not  medically stable to d/c.  Plan d/c 5/22 if stable         Family Communication:  No Family at bedside  Consultants:  none  Code Status:  FULL   DVT Prophylaxis:   [place] Lovenox   Procedures: As Listed in Progress Note Above  Antibiotics: None       Subjective: Pt still has sob with going to bathroom.  Has dry cough.  Denies f/c, cp, n/v/d, abd pain  Objective: Vitals:   09/09/19 1000 09/09/19 1100 09/09/19 1124 09/09/19 1148  BP: (!) 97/42 (!) 86/53  134/67  Pulse: (!) 106 99  (!) 108  Resp: (!) 22 (!) 26  (!) 29  Temp:   98.2 F (36.8 C)   TempSrc:   Oral   SpO2: 93% 94%  92%  Weight:      Height:        Intake/Output Summary (Last 24 hours) at 09/09/2019 1203 Last data filed at 09/09/2019 8546 Gross per 24 hour  Intake 787.44 ml  Output --  Net 787.44 ml   Weight change: -1.406 kg Exam:   General:  Pt is alert, follows commands appropriately, not in acute distress  HEENT: No icterus, No thrush, No neck mass, [place]/AT  Cardiovascular: RRR, S1/S2, no rubs, no gallops  Respiratory: bibasilar rales.  Mild bibasilar wheeze  Abdomen: Soft/+BS, non tender, non distended, no guarding  Extremities: No edema,  No lymphangitis, No petechiae, No rashes, no synovitis   Data Reviewed: I have personally reviewed following labs and imaging studies Basic Metabolic Panel: Recent Labs  Lab 09/08/19 0554 09/09/19 0323  NA 134* 128*  K 4.1 4.3  CL 98 92*  CO2 26 24  GLUCOSE 121* 129*  BUN 9 17  CREATININE 0.70 0.84  CALCIUM 9.5 9.1  MG  --  2.2   Liver Function Tests: Recent Labs  Lab 09/08/19 0554  AST 18  ALT 19  ALKPHOS 82  BILITOT 1.0  PROT 7.5  ALBUMIN 4.2   No results for input(s): LIPASE, AMYLASE in the last 168 hours. No results for input(s): AMMONIA in the last 168 hours. Coagulation Profile: No results for input(s): INR, PROTIME in the last 168 hours. CBC: Recent Labs  Lab 09/08/19 0554 09/09/19 0323  WBC 7.4 18.3*  NEUTROABS  4.1  --   HGB 15.3* 12.8  HCT 44.7 38.1  MCV 95.3 94.8  PLT 272 234   Cardiac Enzymes: No results for input(s): CKTOTAL, CKMB, CKMBINDEX, TROPONINI in the last 168 hours. BNP: Invalid input(s): POCBNP CBG: No results for input(s): GLUCAP in the last 168 hours. HbA1C: No results for input(s): HGBA1C in the last 72 hours. Urine analysis: No results found for: COLORURINE, APPEARANCEUR, LABSPEC, PHURINE, GLUCOSEU, HGBUR, BILIRUBINUR, KETONESUR, PROTEINUR, UROBILINOGEN, NITRITE, LEUKOCYTESUR Sepsis Labs: @LABRCNTIP (procalcitonin:4,lacticidven:4) ) Recent Results (from the past 240 hour(s))  SARS Coronavirus 2 by RT PCR (hospital order, performed in [facility] hospital lab) Nasopharyngeal Nasopharyngeal Swab     Status: None   Collection Time: 09/08/19  5:48 AM   Specimen: Nasopharyngeal Swab  Result Value Ref Range Status   SARS Coronavirus 2 NEGATIVE NEGATIVE Final    Comment: (NOTE) SARS-CoV-2 target nucleic acids are NOT DETECTED. The SARS-CoV-2 RNA is generally detectable in upper and lower respiratory specimens during the acute phase of infection. The lowest concentration of SARS-CoV-2 viral copies this assay can detect is 250 copies / mL. A negative result does not preclude SARS-CoV-2 infection and should not be used as the sole basis for treatment or other patient management decisions.  A negative result may occur with improper specimen collection / handling, submission of specimen other than nasopharyngeal swab, presence of viral mutation(s) within the areas targeted by this assay, and inadequate number of viral copies (<250 copies / mL). A negative result must be combined with clinical observations, patient history, and epidemiological information. Fact Sheet for Patients:   09/10/19 Fact Sheet for Healthcare Providers: BoilerBrush.com.cy This test is not yet approved or cleared  by the https://pope.com/ FDA and has  been authorized for detection and/or diagnosis of SARS-CoV-2 by FDA under an Emergency Use Authorization (EUA).  This EUA will remain in effect (meaning this test can be used) for the duration of the COVID-19 declaration under Section 564(b)(1) of the Act, 21 U.S.C. section 360bbb-3(b)(1), unless the authorization is terminated or revoked sooner. Performed at [facility], [address], [place], [place]   MRSA PCR Screening     Status: None   Collection Time: 09/08/19 11:12 AM   Specimen: Nasal Mucosa; Nasopharyngeal  Result Value Ref Range Status   MRSA by PCR NEGATIVE NEGATIVE Final    Comment:        The GeneXpert MRSA Assay (FDA approved for NASAL specimens only), is one component of a comprehensive MRSA colonization surveillance program. It is not intended to diagnose MRSA infection nor to guide or monitor treatment for MRSA infections. Performed  at [facility], [address] [place], [place] [address]      Scheduled Meds: . budesonide (PULMICORT) nebulizer solution  0.5 mg Nebulization BID  . Chlorhexidine Gluconate Cloth  6 each Topical Q0600  . enoxaparin (LOVENOX) injection  40 mg Subcutaneous Q24H  . ipratropium  0.5 mg Nebulization Q12H  . levalbuterol  0.63 mg Nebulization Q12H  . methylPREDNISolone (SOLU-MEDROL) injection  60 mg Intravenous Q8H  . metoprolol tartrate  12.5 mg Oral BID  . pantoprazole  40 mg Oral Daily  . [START ON 09/10/2019] predniSONE  60 mg Oral Q breakfast   Continuous Infusions:  Procedures/Studies: DG Chest [place] 1 View  Result Date: 09/08/2019 CLINICAL DATA:  Shortness of breath and wheezing EXAM: PORTABLE CHEST 1 VIEW COMPARISON:  08/08/2018 FINDINGS: Large lung volumes with emphysematous markings. There is no edema, consolidation, effusion, or pneumothorax. Normal heart size and mediastinal contours. Generalized degenerative endplate spurring IMPRESSION: Emphysema.  No acute superimposed finding. Electronically Signed   By:  Marnee Spring M.D.   On: 09/08/2019 06:15   ECHOCARDIOGRAM COMPLETE  Result Date: 09/08/2019    ECHOCARDIOGRAM REPORT   Patient Name:   Abigail Evans Date of Exam: 09/08/2019 Medical Rec #:  [id number]     Height:       65.0 in Accession #:    [id number]    Weight:       139.0 lb Date of Birth:  Dec 06, 1945     BSA:          1.695 m Patient Age:    74 years      BP:           124/44 mmHg Patient Gender: F             HR:           90 bpm. [facility] Procedure: 2D Echo, Cardiac Doppler and Color Doppler Indications:    R00.8 Other abnormalities of heart beat. Supraventricular                 tachycardia.  History:        Patient has no prior history of Echocardiogram examinations.                 Abnormal ECG and Tachycardia, COPD, Arrythmias:SVT;                 Signs/Symptoms:Dyspnea. Hypoxia.  Sonographer:    Sheralyn Boatman RDCS (AE) Referring Phys: [phone] Esther Broyles IMPRESSIONS  1. Left ventricular ejection fraction, by estimation, is 65 to 70%. The left ventricle has normal function. The left ventricle has no regional wall motion abnormalities. There is mild left ventricular hypertrophy. Left ventricular diastolic parameters were normal.  2. Right ventricular systolic function is normal. The right ventricular size is normal. There is mildly elevated pulmonary artery systolic pressure.  3. The mitral valve is normal in structure. No evidence of mitral valve regurgitation. No evidence of mitral stenosis.  4. The aortic valve was not well visualized. Aortic valve regurgitation is not visualized. No aortic stenosis is present.  5. The inferior vena cava is normal in size with greater than 50% respiratory variability, suggesting right atrial pressure of 3 mmHg. FINDINGS  Left Ventricle: Left ventricular ejection fraction, by estimation, is 65 to 70%. The left ventricle has normal function. The left ventricle has no regional wall motion abnormalities. The left ventricular internal cavity size was normal in  size. There is  mild left ventricular hypertrophy.  Left ventricular diastolic parameters were normal. Right Ventricle: The right ventricular size is normal. No increase in right ventricular wall thickness. Right ventricular systolic function is normal. There is mildly elevated pulmonary artery systolic pressure. The tricuspid regurgitant velocity is 2.70  m/s, and with an assumed right atrial pressure of 8 mmHg, the estimated right ventricular systolic pressure is 05.3 mmHg. Left Atrium: Left atrial size was normal in size. Right Atrium: Right atrial size was normal in size. Pericardium: There is no evidence of pericardial effusion. Mitral Valve: The mitral valve is normal in structure. No evidence of mitral valve regurgitation. No evidence of mitral valve stenosis. Tricuspid Valve: The tricuspid valve is normal in structure. Tricuspid valve regurgitation is trivial. No evidence of tricuspid stenosis. Aortic Valve: The aortic valve was not well visualized. Aortic valve regurgitation is not visualized. No aortic stenosis is present. Aortic valve mean gradient measures 5.7 mmHg. Aortic valve peak gradient measures 13.4 mmHg. Aortic valve area, by VTI measures 2.24 cm. Pulmonic Valve: The pulmonic valve was not well visualized. Pulmonic valve regurgitation is not visualized. No evidence of pulmonic stenosis. Aorta: The aortic root is normal in size and structure. Pulmonary Artery: Indeterminant PASP, inadequate TR jet. Venous: The inferior vena cava is normal in size with greater than 50% respiratory variability, suggesting right atrial pressure of 3 mmHg. IAS/Shunts: The interatrial septum appears to be lipomatous. No atrial level shunt detected by color flow Doppler.  LEFT VENTRICLE PLAX 2D LVIDd:         3.89 cm     Diastology LVIDs:         2.78 cm     LV e' lateral:   9.36 cm/s LV PW:         1.16 cm     LV E/e' lateral: 9.8 LV IVS:        1.14 cm     LV e' medial:    7.72 cm/s LVOT diam:     1.80 cm     LV E/e'  medial:  11.8 LV SV:         57 LV SV Index:   34 LVOT Area:     2.54 cm  LV Volumes (MOD) LV vol d, MOD A2C: 62.0 ml LV vol d, MOD A4C: 40.9 ml LV vol s, MOD A2C: 17.7 ml LV vol s, MOD A4C: 15.7 ml LV SV MOD A2C:     44.3 ml LV SV MOD A4C:     40.9 ml LV SV MOD BP:      37.7 ml RIGHT VENTRICLE             IVC RV S prime:     26.80 cm/s  IVC diam: 1.74 cm TAPSE (M-mode): 2.3 cm LEFT ATRIUM             Index       RIGHT ATRIUM          Index LA diam:        2.80 cm 1.65 cm/m  RA Area:     8.05 cm LA Vol (A2C):   31.3 ml 18.47 ml/m RA Volume:   13.80 ml 8.14 ml/m LA Vol (A4C):   16.3 ml 9.62 ml/m LA Biplane Vol: 25.2 ml 14.87 ml/m  AORTIC VALVE AV Area (Vmax):    2.10 cm AV Area (Vmean):   2.22 cm AV Area (VTI):     2.24 cm AV Vmax:           183.05 cm/s  AV Vmean:          109.867 cm/s AV VTI:            0.255 m AV Peak Grad:      13.4 mmHg AV Mean Grad:      5.7 mmHg LVOT Vmax:         151.00 cm/s LVOT Vmean:        95.900 cm/s LVOT VTI:          0.225 m LVOT/AV VTI ratio: 0.88  AORTA Ao Root diam: 3.00 cm MITRAL VALVE               TRICUSPID VALVE MV Area (PHT): 3.72 cm    TV Peak grad:   24.4 mmHg MV Decel Time: 204 msec    TV Vmax:        2.47 m/s MV E velocity: 91.30 cm/s  TR Peak grad:   29.2 mmHg MV A velocity: 79.30 cm/s  TR Vmax:        270.00 cm/s MV E/A ratio:  1.15                            SHUNTS                            Systemic VTI:  0.22 m                            Systemic Diam: 1.80 cm Dina RichJonathan Branch MD Electronically signed by Dina RichJonathan Branch MD Signature Date/Time: 09/08/2019/3:59:17 PM    Final     Catarina Hartshornavid Sindia Kowalczyk, DO  [facility]  If 7PM-7AM, please contact night-coverage [email or web address] Password [email or web address] 09/09/2019, 12:03 PM   LOS: 1 day

## 2019-09-10 DIAGNOSIS — J9601 Acute respiratory failure with hypoxia: Secondary | ICD-10-CM

## 2019-09-10 LAB — MAGNESIUM: Magnesium: 2.1 mg/dL (ref 1.7–2.4)

## 2019-09-10 LAB — BASIC METABOLIC PANEL
Anion gap: 6 (ref 5–15)
BUN: 18 mg/dL (ref 8–23)
CO2: 26 mmol/L (ref 22–32)
Calcium: 8.8 mg/dL — ABNORMAL LOW (ref 8.9–10.3)
Chloride: 95 mmol/L — ABNORMAL LOW (ref 98–111)
Creatinine, Ser: 0.65 mg/dL (ref 0.44–1.00)
GFR calc Af Amer: >60 mL/min (ref >60)
GFR calc non Af Amer: >60 mL/min (ref >60)
Glucose, Bld: 120 mg/dL — ABNORMAL HIGH (ref 70–99)
Potassium: 4.9 mmol/L (ref 3.5–5.1)
Sodium: 127 mmol/L — ABNORMAL LOW (ref 135–145)

## 2019-09-10 MED ORDER — BREO ELLIPTA 100-25 MCG/INH IN AEPB: 1.0000 | INHALATION_SPRAY | Freq: Every day | RESPIRATORY_TRACT | 1 refills | Status: DC

## 2019-09-10 MED ORDER — METOPROLOL TARTRATE 25 MG PO TABS: 25.0000 mg | ORAL_TABLET | Freq: Two times a day (BID) | ORAL | 1 refills | Status: DC

## 2019-09-10 MED ORDER — PREDNISONE 10 MG PO TABS: 60.0000 mg | ORAL_TABLET | Freq: Every day | ORAL | 0 refills | Status: DC

## 2019-09-10 MED ORDER — PREDNISONE 20 MG PO TABS: 60.0000 mg | ORAL_TABLET | Freq: Every day | ORAL | 0 refills | Status: DC

## 2019-09-10 MED ORDER — PREDNISONE 20 MG PO TABS
40.0000 mg | ORAL_TABLET | Freq: Once | ORAL | Status: AC
  Administered 2019-09-10: 40 mg via ORAL
  Filled 2019-09-10: qty 2

## 2019-09-10 NOTE — Progress Notes (Signed)
SATURATION QUALIFICATIONS: (This note is used to comply with regulatory documentation for home oxygen)  Patient Saturations on Room Air at Rest = 95%  Patient Saturations on Room Air while Ambulating = 87%  Patient Saturations on 2 Liters of oxygen= 96%  Please briefly explain why patient needs home oxygen: To maintain 02 sat at 90% or above during ambulation.   DTat

## 2019-09-10 NOTE — Discharge Summary (Signed)
Physician Discharge Summary  Abigail Evans RUE:[id number] DOB: [birth year] DOA: 09/08/2019  PCP: Elfredia NevinsFusco, Lawrence, MD  Admit date: 09/08/2019 Discharge date: 09/10/2019  Admitted From: Home Disposition:  Home  Recommendations for Outpatient Follow-up:  1. Follow up with PCP in 1-2 weeks 2. Please obtain BMP/CBC in one week 3. Wean off 2L [place] as tolerated for saturation >92%    Discharge Condition: Stable CODE STATUS: FULL Diet recommendation: Heart Healthy   Brief/Interim Summary: 74 y.o.femalewith medical history ofCOPD, GERD presenting with about 1 month history of shortness of breath that has worsened over the past week. The patient states that she went to see her primary care provider about 1 month ago. She was given some type of shot and azithromycin. She stated that it helped temporarily, but states that her shortness of breath and wheezing returned this past week. She denied any fevers, chills, nausea, vomiting, diarrhea, abdominal pain. She has some intermittent chest discomfort. She denies any orthopnea or PND. There is been no dysuria, hematuria, hematochezia, melena. She quit smoking 1 year ago after approximately 25-pack-year history. In the emergency department, the patient was afebrile hemodynamically stable with oxygen saturation 93-97% room air. WBC 7.4, hemoglobin 15.3, platelets 272,000. BMP and LFTs were essentially unremarkable. Lactic acid 1.1. BNP 19.0. Chest x-ray showed hyperinflation without any consolidations. The patient was given albuterol with precipitation of SVT with heart rate in the 140s.  She was started on diltiazem drip and admitted to step down unit.  She was weaned off diltiazem drip and switched to po metoprolol.   Discharge Diagnoses:  Acute respiratory failure with hypoxia -intermittently required 2 L -09/10/19--ambulatory pulse ox -->desaturated <88% -[facility]  COPD exacerbation -continue Pulmicort -continue duo  nebs--switched to xopenex due to tachycardia -continue IV Solu-Medrol>>prednisone taper -Rx Breo at time of d/c  SVT/MAT -Start diltiazem drip>>converted to sinus -now in sinus tach-->started low dose metoprolol -Echo--EF 65-70%, no WMA, mild elevated PASP -TSH 0.131 -Free T4--1.16 -repeat thyroid functions in 1 month when patient is stable clinically  GERD -Continue PPI   Discharge Instructions   Allergies as of 09/10/2019      Reactions   Doxycycline Nausea And Vomiting      Medication List    TAKE these medications   albuterol 108 (90 Base) MCG/ACT inhaler Commonly known as: VENTOLIN HFA Inhale 1 puff into the lungs 4 (four) times daily. 1 puff every 4 to 6 hours as nedded   Breo Ellipta 100-25 MCG/INH Aepb Generic drug: fluticasone furoate-vilanterol Inhale 1 puff into the lungs daily.   metoprolol tartrate 25 MG tablet Commonly known as: LOPRESSOR Take 1 tablet (25 mg total) by mouth 2 (two) times daily.   predniSONE 10 MG tablet Commonly known as: DELTASONE Take 6 tablets (60 mg total) by mouth daily with breakfast. And decrease by one tablet daily Start taking on: Sep 11, 2019   Spiriva Respimat 2.5 MCG/ACT Aers Generic drug: Tiotropium Bromide Monohydrate Take 2 puffs by mouth daily.   VITAMIN D3 PO Take 1 tablet by mouth daily.            Durable Medical Equipment  (From admission, onward)         Start     Ordered   09/10/19 1616  For home use only DME oxygen  Once    Question Answer Comment  Length of Need 6 Months   Mode or (Route) Nasal cannula   Liters per Minute 2   Frequency Continuous (stationary and portable  oxygen unit needed)   Oxygen conserving device No   Oxygen delivery system Gas      09/10/19 1615          Allergies  Allergen Reactions  . Doxycycline Nausea And Vomiting    Consultations:  none   Procedures/Studies: DG Chest Port 1 View  Result Date: 09/08/2019 CLINICAL DATA:  Shortness of breath and  wheezing EXAM: PORTABLE CHEST 1 VIEW COMPARISON:  08/08/2018 FINDINGS: Large lung volumes with emphysematous markings. There is no edema, consolidation, effusion, or pneumothorax. Normal heart size and mediastinal contours. Generalized degenerative endplate spurring IMPRESSION: Emphysema.  No acute superimposed finding. Electronically Signed   By: Marnee Spring M.D.   On: 09/08/2019 06:15   ECHOCARDIOGRAM COMPLETE  Result Date: 09/08/2019    ECHOCARDIOGRAM REPORT   Patient Name:   Abigail Evans Date of Exam: 09/08/2019 Medical Rec #:  [id number]     Height:       65.0 in Accession #:    [id number]    Weight:       139.0 lb Date of Birth:  1946-04-19     BSA:          1.695 m Patient Age:    74 years      BP:           124/44 mmHg Patient Gender: F             HR:           90 bpm. [facility] Procedure: 2D Echo, Cardiac Doppler and Color Doppler Indications:    R00.8 Other abnormalities of heart beat. Supraventricular                 tachycardia.  History:        Patient has no prior history of Echocardiogram examinations.                 Abnormal ECG and Tachycardia, COPD, Arrythmias:SVT;                 Signs/Symptoms:Dyspnea. Hypoxia.  Sonographer:    Sheralyn Boatman RDCS (AE) Referring Phys: [id number] Dak Szumski IMPRESSIONS  1. Left ventricular ejection fraction, by estimation, is 65 to 70%. The left ventricle has normal function. The left ventricle has no regional wall motion abnormalities. There is mild left ventricular hypertrophy. Left ventricular diastolic parameters were normal.  2. Right ventricular systolic function is normal. The right ventricular size is normal. There is mildly elevated pulmonary artery systolic pressure.  3. The mitral valve is normal in structure. No evidence of mitral valve regurgitation. No evidence of mitral stenosis.  4. The aortic valve was not well visualized. Aortic valve regurgitation is not visualized. No aortic stenosis is present.  5. The inferior vena cava is  normal in size with greater than 50% respiratory variability, suggesting right atrial pressure of 3 mmHg. FINDINGS  Left Ventricle: Left ventricular ejection fraction, by estimation, is 65 to 70%. The left ventricle has normal function. The left ventricle has no regional wall motion abnormalities. The left ventricular internal cavity size was normal in size. There is  mild left ventricular hypertrophy. Left ventricular diastolic parameters were normal. Right Ventricle: The right ventricular size is normal. No increase in right ventricular wall thickness. Right ventricular systolic function is normal. There is mildly elevated pulmonary artery systolic pressure. The tricuspid regurgitant velocity is 2.70  m/s, and with an assumed right atrial pressure of 8 mmHg, the estimated right ventricular systolic  pressure is 37.2 mmHg. Left Atrium: Left atrial size was normal in size. Right Atrium: Right atrial size was normal in size. Pericardium: There is no evidence of pericardial effusion. Mitral Valve: The mitral valve is normal in structure. No evidence of mitral valve regurgitation. No evidence of mitral valve stenosis. Tricuspid Valve: The tricuspid valve is normal in structure. Tricuspid valve regurgitation is trivial. No evidence of tricuspid stenosis. Aortic Valve: The aortic valve was not well visualized. Aortic valve regurgitation is not visualized. No aortic stenosis is present. Aortic valve mean gradient measures 5.7 mmHg. Aortic valve peak gradient measures 13.4 mmHg. Aortic valve area, by VTI measures 2.24 cm. Pulmonic Valve: The pulmonic valve was not well visualized. Pulmonic valve regurgitation is not visualized. No evidence of pulmonic stenosis. Aorta: The aortic root is normal in size and structure. Pulmonary Artery: Indeterminant PASP, inadequate TR jet. Venous: The inferior vena cava is normal in size with greater than 50% respiratory variability, suggesting right atrial pressure of 3 mmHg. IAS/Shunts:  The interatrial septum appears to be lipomatous. No atrial level shunt detected by color flow Doppler.  LEFT VENTRICLE PLAX 2D LVIDd:         3.89 cm     Diastology LVIDs:         2.78 cm     LV e' lateral:   9.36 cm/s LV PW:         1.16 cm     LV E/e' lateral: 9.8 LV IVS:        1.14 cm     LV e' medial:    7.72 cm/s LVOT diam:     1.80 cm     LV E/e' medial:  11.8 LV SV:         57 LV SV Index:   34 LVOT Area:     2.54 cm  LV Volumes (MOD) LV vol d, MOD A2C: 62.0 ml LV vol d, MOD A4C: 40.9 ml LV vol s, MOD A2C: 17.7 ml LV vol s, MOD A4C: 15.7 ml LV SV MOD A2C:     44.3 ml LV SV MOD A4C:     40.9 ml LV SV MOD BP:      37.7 ml RIGHT VENTRICLE             IVC RV S prime:     26.80 cm/s  IVC diam: 1.74 cm TAPSE (M-mode): 2.3 cm LEFT ATRIUM             Index       RIGHT ATRIUM          Index LA diam:        2.80 cm 1.65 cm/m  RA Area:     8.05 cm LA Vol (A2C):   31.3 ml 18.47 ml/m RA Volume:   13.80 ml 8.14 ml/m LA Vol (A4C):   16.3 ml 9.62 ml/m LA Biplane Vol: 25.2 ml 14.87 ml/m  AORTIC VALVE AV Area (Vmax):    2.10 cm AV Area (Vmean):   2.22 cm AV Area (VTI):     2.24 cm AV Vmax:           183.05 cm/s AV Vmean:          109.867 cm/s AV VTI:            0.255 m AV Peak Grad:      13.4 mmHg AV Mean Grad:      5.7 mmHg LVOT Vmax:  151.00 cm/s LVOT Vmean:        95.900 cm/s LVOT VTI:          0.225 m LVOT/AV VTI ratio: 0.88  AORTA Ao Root diam: 3.00 cm MITRAL VALVE               TRICUSPID VALVE MV Area (PHT): 3.72 cm    TV Peak grad:   24.4 mmHg MV Decel Time: 204 msec    TV Vmax:        2.47 m/s MV E velocity: 91.30 cm/s  TR Peak grad:   29.2 mmHg MV A velocity: 79.30 cm/s  TR Vmax:        270.00 cm/s MV E/A ratio:  1.15                            SHUNTS                            Systemic VTI:  0.22 m                            Systemic Diam: 1.80 cm Dina Rich MD Electronically signed by Dina Rich MD Signature Date/Time: 09/08/2019/3:59:17 PM    Final          Discharge  Exam: Vitals:   09/10/19 1516 09/10/19 1601  BP:  (!) 150/102  Pulse: 100 90  Resp: (!) 26 (!) 27  Temp: 97.9 F (36.6 C)   SpO2: 96% 95%   Vitals:   09/10/19 1122 09/10/19 1200 09/10/19 1516 09/10/19 1601  BP:  136/65  (!) 150/102  Pulse:  86 100 90  Resp:  (!) 21 (!) 26 (!) 27  Temp: 98.5 F (36.9 C)  97.9 F (36.6 C)   TempSrc: Oral  Oral   SpO2:  98% 96% 95%  Weight:      Height:        General: Pt is alert, awake, not in acute distress Cardiovascular: RRR, S1/S2 +, no rubs, no gallops Respiratory: CTA bilaterally, no wheezing, no rhonchi Abdominal: Soft, NT, ND, bowel sounds + Extremities: no edema, no cyanosis   The results of significant diagnostics from this hospitalization (including imaging, microbiology, ancillary and laboratory) are listed below for reference.    Significant Diagnostic Studies: DG Chest Port 1 View  Result Date: 09/08/2019 CLINICAL DATA:  Shortness of breath and wheezing EXAM: PORTABLE CHEST 1 VIEW COMPARISON:  08/08/2018 FINDINGS: Large lung volumes with emphysematous markings. There is no edema, consolidation, effusion, or pneumothorax. Normal heart size and mediastinal contours. Generalized degenerative endplate spurring IMPRESSION: Emphysema.  No acute superimposed finding. Electronically Signed   By: Marnee Spring M.D.   On: 09/08/2019 06:15   ECHOCARDIOGRAM COMPLETE  Result Date: 09/08/2019    ECHOCARDIOGRAM REPORT   Patient Name:   Abigail Evans Date of Exam: 09/08/2019 Medical Rec #:  [id number]     Height:       65.0 in Accession #:    [id number]    Weight:       139.0 lb Date of Birth:  [birth year]     BSA:          1.695 m Patient Age:    74 years      BP:           124/44 mmHg Patient Gender: F  HR:           90 bpm. [facility] Procedure: 2D Echo, Cardiac Doppler and Color Doppler Indications:    R00.8 Other abnormalities of heart beat. Supraventricular                 tachycardia.  History:        Patient has  no prior history of Echocardiogram examinations.                 Abnormal ECG and Tachycardia, COPD, Arrythmias:SVT;                 Signs/Symptoms:Dyspnea. Hypoxia.  Sonographer:    Sheralyn Boatman RDCS (AE) Referring Phys: [phone] Jacory Kamel IMPRESSIONS  1. Left ventricular ejection fraction, by estimation, is 65 to 70%. The left ventricle has normal function. The left ventricle has no regional wall motion abnormalities. There is mild left ventricular hypertrophy. Left ventricular diastolic parameters were normal.  2. Right ventricular systolic function is normal. The right ventricular size is normal. There is mildly elevated pulmonary artery systolic pressure.  3. The mitral valve is normal in structure. No evidence of mitral valve regurgitation. No evidence of mitral stenosis.  4. The aortic valve was not well visualized. Aortic valve regurgitation is not visualized. No aortic stenosis is present.  5. The inferior vena cava is normal in size with greater than 50% respiratory variability, suggesting right atrial pressure of 3 mmHg. FINDINGS  Left Ventricle: Left ventricular ejection fraction, by estimation, is 65 to 70%. The left ventricle has normal function. The left ventricle has no regional wall motion abnormalities. The left ventricular internal cavity size was normal in size. There is  mild left ventricular hypertrophy. Left ventricular diastolic parameters were normal. Right Ventricle: The right ventricular size is normal. No increase in right ventricular wall thickness. Right ventricular systolic function is normal. There is mildly elevated pulmonary artery systolic pressure. The tricuspid regurgitant velocity is 2.70  m/s, and with an assumed right atrial pressure of 8 mmHg, the estimated right ventricular systolic pressure is 37.2 mmHg. Left Atrium: Left atrial size was normal in size. Right Atrium: Right atrial size was normal in size. Pericardium: There is no evidence of pericardial effusion. Mitral Valve: The  mitral valve is normal in structure. No evidence of mitral valve regurgitation. No evidence of mitral valve stenosis. Tricuspid Valve: The tricuspid valve is normal in structure. Tricuspid valve regurgitation is trivial. No evidence of tricuspid stenosis. Aortic Valve: The aortic valve was not well visualized. Aortic valve regurgitation is not visualized. No aortic stenosis is present. Aortic valve mean gradient measures 5.7 mmHg. Aortic valve peak gradient measures 13.4 mmHg. Aortic valve area, by VTI measures 2.24 cm. Pulmonic Valve: The pulmonic valve was not well visualized. Pulmonic valve regurgitation is not visualized. No evidence of pulmonic stenosis. Aorta: The aortic root is normal in size and structure. Pulmonary Artery: Indeterminant PASP, inadequate TR jet. Venous: The inferior vena cava is normal in size with greater than 50% respiratory variability, suggesting right atrial pressure of 3 mmHg. IAS/Shunts: The interatrial septum appears to be lipomatous. No atrial level shunt detected by color flow Doppler.  LEFT VENTRICLE PLAX 2D LVIDd:         3.89 cm     Diastology LVIDs:         2.78 cm     LV e' lateral:   9.36 cm/s LV PW:         1.16 cm  LV E/e' lateral: 9.8 LV IVS:        1.14 cm     LV e' medial:    7.72 cm/s LVOT diam:     1.80 cm     LV E/e' medial:  11.8 LV SV:         57 LV SV Index:   34 LVOT Area:     2.54 cm  LV Volumes (MOD) LV vol d, MOD A2C: 62.0 ml LV vol d, MOD A4C: 40.9 ml LV vol s, MOD A2C: 17.7 ml LV vol s, MOD A4C: 15.7 ml LV SV MOD A2C:     44.3 ml LV SV MOD A4C:     40.9 ml LV SV MOD BP:      37.7 ml RIGHT VENTRICLE             IVC RV S prime:     26.80 cm/s  IVC diam: 1.74 cm TAPSE (M-mode): 2.3 cm LEFT ATRIUM             Index       RIGHT ATRIUM          Index LA diam:        2.80 cm 1.65 cm/m  RA Area:     8.05 cm LA Vol (A2C):   31.3 ml 18.47 ml/m RA Volume:   13.80 ml 8.14 ml/m LA Vol (A4C):   16.3 ml 9.62 ml/m LA Biplane Vol: 25.2 ml 14.87 ml/m  AORTIC VALVE  AV Area (Vmax):    2.10 cm AV Area (Vmean):   2.22 cm AV Area (VTI):     2.24 cm AV Vmax:           183.05 cm/s AV Vmean:          109.867 cm/s AV VTI:            0.255 m AV Peak Grad:      13.4 mmHg AV Mean Grad:      5.7 mmHg LVOT Vmax:         151.00 cm/s LVOT Vmean:        95.900 cm/s LVOT VTI:          0.225 m LVOT/AV VTI ratio: 0.88  AORTA Ao Root diam: 3.00 cm MITRAL VALVE               TRICUSPID VALVE MV Area (PHT): 3.72 cm    TV Peak grad:   24.4 mmHg MV Decel Time: 204 msec    TV Vmax:        2.47 m/s MV E velocity: 91.30 cm/s  TR Peak grad:   29.2 mmHg MV A velocity: 79.30 cm/s  TR Vmax:        270.00 cm/s MV E/A ratio:  1.15                            SHUNTS                            Systemic VTI:  0.22 m                            Systemic Diam: 1.80 cm Dina Rich MD Electronically signed by Dina Rich MD Signature Date/Time: 09/08/2019/3:59:17 PM    Final      Microbiology: Recent Results (from the past 240 hour(s))  SARS  Coronavirus 2 by RT PCR (hospital order, performed in [facility] hospital lab) Nasopharyngeal Nasopharyngeal Swab     Status: None   Collection Time: 09/08/19  5:48 AM   Specimen: Nasopharyngeal Swab  Result Value Ref Range Status   SARS Coronavirus 2 NEGATIVE NEGATIVE Final    Comment: (NOTE) SARS-CoV-2 target nucleic acids are NOT DETECTED. The SARS-CoV-2 RNA is generally detectable in upper and lower respiratory specimens during the acute phase of infection. The lowest concentration of SARS-CoV-2 viral copies this assay can detect is 250 copies / mL. A negative result does not preclude SARS-CoV-2 infection and should not be used as the sole basis for treatment or other patient management decisions.  A negative result may occur with improper specimen collection / handling, submission of specimen other than nasopharyngeal swab, presence of viral mutation(s) within the areas targeted by this assay, and inadequate number of viral copies (<250 copies  / mL). A negative result must be combined with clinical observations, patient history, and epidemiological information. Fact Sheet for Patients:   BoilerBrush.com.cy Fact Sheet for Healthcare Providers: https://pope.com/ This test is not yet approved or cleared  by the [place] FDA and has been authorized for detection and/or diagnosis of SARS-CoV-2 by FDA under an Emergency Use Authorization (EUA).  This EUA will remain in effect (meaning this test can be used) for the duration of the COVID-19 declaration under Section 564(b)(1) of the Act, 21 U.S.C. section 360bbb-3(b)(1), unless the authorization is terminated or revoked sooner. Performed at [facility], [address] [place], [place] [address]   MRSA PCR Screening     Status: None   Collection Time: 09/08/19 11:12 AM   Specimen: Nasal Mucosa; Nasopharyngeal  Result Value Ref Range Status   MRSA by PCR NEGATIVE NEGATIVE Final    Comment:        The GeneXpert MRSA Assay (FDA approved for NASAL specimens only), is one component of a comprehensive MRSA colonization surveillance program. It is not intended to diagnose MRSA infection nor to guide or monitor treatment for MRSA infections. Performed at [facility], [address] [place], [place] [address]      Labs: Basic Metabolic Panel: Recent Labs  Lab 09/08/19 0554 09/08/19 0554 09/09/19 0323 09/10/19 0432  NA 134*  --  128* 127*  K 4.1   < > 4.3 4.9  CL 98  --  92* 95*  CO2 26  --  24 26  GLUCOSE 121*  --  129* 120*  BUN 9  --  17 18  CREATININE 0.70  --  0.84 0.65  CALCIUM 9.5  --  9.1 8.8*  MG  --   --  2.2 2.1   < > = values in this interval not displayed.   Liver Function Tests: Recent Labs  Lab 09/08/19 0554  AST 18  ALT 19  ALKPHOS 82  BILITOT 1.0  PROT 7.5  ALBUMIN 4.2   No results for input(s): LIPASE, AMYLASE in the last 168 hours. No results for input(s): AMMONIA in the last 168  hours. CBC: Recent Labs  Lab 09/08/19 0554 09/09/19 0323  WBC 7.4 18.3*  NEUTROABS 4.1  --   HGB 15.3* 12.8  HCT 44.7 38.1  MCV 95.3 94.8  PLT 272 234   Cardiac Enzymes: No results for input(s): CKTOTAL, CKMB, CKMBINDEX, TROPONINI in the last 168 hours. BNP: Invalid input(s): POCBNP CBG: No results for input(s): GLUCAP in the last 168 hours.  Time coordinating discharge:  36 minutes  Signed:  Catarina Hartshorn, DO [facility] Pager: [phone] 09/10/2019, 5:24 PM

## 2019-09-10 NOTE — TOC Progression Note (Signed)
Transition of Care [facility]) - Progression Note    Patient Details  Name: Abigail Evans MRN: [id number] Date of Birth: Jun 01, 1945  Transition of Care [facility]) CM/SW Contact  Wilhelmine Krogstad, [place], [place] Phone Number: 09/10/2019, 3:45 PM  Clinical Narrative:    Phone call [facility], spoke with Therisa Doyne to place order for oxygen. Per Olegario Messier , she will get referral processed as soon as possible.   Charna Neeb, LCSW Transitions of Care [phone]     Expected Discharge Plan: Home/Self Care Barriers to Discharge: Continued Medical Work up  Expected Discharge Plan and Services Expected Discharge Plan: Home/Self Care                                               Social Determinants of Health (SDOH) Interventions    Readmission Risk Interventions No flowsheet data found.

## 2019-09-11 DIAGNOSIS — J441 Chronic obstructive pulmonary disease with (acute) exacerbation: Secondary | ICD-10-CM | POA: Diagnosis not present

## 2019-09-16 DIAGNOSIS — Z6824 Body mass index (BMI) 24.0-24.9, adult: Secondary | ICD-10-CM | POA: Diagnosis not present

## 2019-09-16 DIAGNOSIS — J449 Chronic obstructive pulmonary disease, unspecified: Secondary | ICD-10-CM | POA: Diagnosis not present

## 2019-09-16 DIAGNOSIS — J9600 Acute respiratory failure, unspecified whether with hypoxia or hypercapnia: Secondary | ICD-10-CM | POA: Diagnosis not present

## 2019-09-16 DIAGNOSIS — I471 Supraventricular tachycardia: Secondary | ICD-10-CM | POA: Diagnosis not present

## 2019-09-19 DIAGNOSIS — M810 Age-related osteoporosis without current pathological fracture: Secondary | ICD-10-CM | POA: Diagnosis not present

## 2019-09-19 DIAGNOSIS — G894 Chronic pain syndrome: Secondary | ICD-10-CM | POA: Diagnosis not present

## 2019-09-19 DIAGNOSIS — Z87891 Personal history of nicotine dependence: Secondary | ICD-10-CM | POA: Diagnosis not present

## 2019-09-19 DIAGNOSIS — J449 Chronic obstructive pulmonary disease, unspecified: Secondary | ICD-10-CM | POA: Diagnosis not present

## 2019-09-26 DIAGNOSIS — Z0001 Encounter for general adult medical examination with abnormal findings: Secondary | ICD-10-CM | POA: Diagnosis not present

## 2019-09-26 DIAGNOSIS — Z1389 Encounter for screening for other disorder: Secondary | ICD-10-CM | POA: Diagnosis not present

## 2019-09-26 DIAGNOSIS — Z6824 Body mass index (BMI) 24.0-24.9, adult: Secondary | ICD-10-CM | POA: Diagnosis not present

## 2019-09-26 DIAGNOSIS — Z13220 Encounter for screening for lipoid disorders: Secondary | ICD-10-CM | POA: Diagnosis not present
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
## 2019-10-11 ENCOUNTER — Ambulatory Visit: Payer: PPO | Admitting: Cardiovascular Disease

## 2019-10-11 ENCOUNTER — Encounter: Payer: Self-pay | Admitting: Cardiovascular Disease

## 2019-10-11 ENCOUNTER — Other Ambulatory Visit: Payer: Self-pay

## 2019-10-11 VITALS — BP 154/80 | HR 128 | Ht 64.0 in | Wt 137.6 lb

## 2019-10-11 DIAGNOSIS — R0602 Shortness of breath: Secondary | ICD-10-CM

## 2019-10-11 DIAGNOSIS — R000 Tachycardia, unspecified: Secondary | ICD-10-CM

## 2019-10-11 MED ORDER — METOPROLOL TARTRATE 25 MG PO TABS
25.0000 mg | ORAL_TABLET | Freq: Two times a day (BID) | ORAL | 11 refills | Status: DC
Start: 2019-10-11 — End: 2020-10-16

## 2019-10-11 NOTE — Patient Instructions (Signed)
Medication Instructions:  RE-Start Lopressor 25 mg twice a day   *If you need a refill on your cardiac medications before your next appointment, please call your pharmacy*   Lab Work: None today If you have labs (blood work) drawn today and your tests are completely normal, you will receive your results only by: Marland Kitchen MyChart Message (if you have MyChart) OR . A paper copy in the mail If you have any lab test that is abnormal or we need to change your treatment, we will call you to review the results.   Testing/Procedures: None today   Follow-Up: At [facility], you and your health needs are our priority.  As part of our continuing mission to provide you with exceptional heart care, we have created designated Provider Care Teams.  These Care Teams include your primary Cardiologist (physician) and Advanced Practice Providers (APPs -  Physician Assistants and Nurse Practitioners) who all work together to provide you with the care you need, when you need it.  We recommend signing up for the patient portal called "MyChart".  Sign up information is provided on this After Visit Summary.  MyChart is used to connect with patients for Virtual Visits (Telemedicine).  Patients are able to view lab/test results, encounter notes, upcoming appointments, etc.  Non-urgent messages can be sent to your provider as well.   To learn more about what you can do with MyChart, go to [email or web address].    Your next appointment:   3 month(s)  The format for your next appointment:   In Person  Provider:   You may see Charlton Haws, MD or one of the following Advanced Practice Providers on your designated Care Team:    Randall An, PA-C        Other Instructions None      Thank you for choosing [facility] !

## 2019-10-13 ENCOUNTER — Encounter (HOSPITAL_COMMUNITY): Payer: Self-pay

## 2019-10-13 ENCOUNTER — Emergency Department (HOSPITAL_COMMUNITY): Payer: PPO

## 2019-10-13 ENCOUNTER — Other Ambulatory Visit: Payer: Self-pay

## 2019-10-13 ENCOUNTER — Inpatient Hospital Stay (HOSPITAL_COMMUNITY)
Admission: EM | Admit: 2019-10-13 | Discharge: 2019-10-16 | DRG: 190 | Disposition: A | Discharge status: Home Health | Payer: PPO | Attending: Internal Medicine | Admitting: Internal Medicine

## 2019-10-13 DIAGNOSIS — Z8261 Family history of arthritis: Secondary | ICD-10-CM

## 2019-10-13 DIAGNOSIS — E0590 Thyrotoxicosis, unspecified without thyrotoxic crisis or storm: Secondary | ICD-10-CM | POA: Diagnosis not present

## 2019-10-13 DIAGNOSIS — R0603 Acute respiratory distress: Secondary | ICD-10-CM

## 2019-10-13 DIAGNOSIS — J9600 Acute respiratory failure, unspecified whether with hypoxia or hypercapnia: Secondary | ICD-10-CM | POA: Diagnosis present

## 2019-10-13 DIAGNOSIS — J90 Pleural effusion, not elsewhere classified: Secondary | ICD-10-CM | POA: Diagnosis not present

## 2019-10-13 DIAGNOSIS — J9601 Acute respiratory failure with hypoxia: Secondary | ICD-10-CM | POA: Diagnosis present

## 2019-10-13 DIAGNOSIS — J439 Emphysema, unspecified: Secondary | ICD-10-CM | POA: Diagnosis not present

## 2019-10-13 DIAGNOSIS — M8580 Other specified disorders of bone density and structure, unspecified site: Secondary | ICD-10-CM | POA: Diagnosis present

## 2019-10-13 DIAGNOSIS — Z79899 Other long term (current) drug therapy: Secondary | ICD-10-CM | POA: Diagnosis not present

## 2019-10-13 DIAGNOSIS — J441 Chronic obstructive pulmonary disease with (acute) exacerbation: Secondary | ICD-10-CM | POA: Diagnosis not present

## 2019-10-13 DIAGNOSIS — R7989 Other specified abnormal findings of blood chemistry: Secondary | ICD-10-CM | POA: Diagnosis not present

## 2019-10-13 DIAGNOSIS — Z881 Allergy status to other antibiotic agents status: Secondary | ICD-10-CM

## 2019-10-13 DIAGNOSIS — I471 Supraventricular tachycardia: Secondary | ICD-10-CM | POA: Diagnosis present

## 2019-10-13 DIAGNOSIS — Z20822 Contact with and (suspected) exposure to covid-19: Secondary | ICD-10-CM | POA: Diagnosis not present

## 2019-10-13 DIAGNOSIS — Z825 Family history of asthma and other chronic lower respiratory diseases: Secondary | ICD-10-CM

## 2019-10-13 DIAGNOSIS — R0602 Shortness of breath: Secondary | ICD-10-CM | POA: Diagnosis not present

## 2019-10-13 DIAGNOSIS — Z8701 Personal history of pneumonia (recurrent): Secondary | ICD-10-CM

## 2019-10-13 DIAGNOSIS — Z87891 Personal history of nicotine dependence: Secondary | ICD-10-CM

## 2019-10-13 DIAGNOSIS — J449 Chronic obstructive pulmonary disease, unspecified: Secondary | ICD-10-CM | POA: Diagnosis not present

## 2019-10-13 DIAGNOSIS — J471 Bronchiectasis with (acute) exacerbation: Secondary | ICD-10-CM | POA: Diagnosis not present

## 2019-10-13 DIAGNOSIS — K219 Gastro-esophageal reflux disease without esophagitis: Secondary | ICD-10-CM | POA: Diagnosis not present

## 2019-10-13 DIAGNOSIS — M1990 Unspecified osteoarthritis, unspecified site: Secondary | ICD-10-CM | POA: Diagnosis not present

## 2019-10-13 DIAGNOSIS — I4719 Other supraventricular tachycardia: Secondary | ICD-10-CM

## 2019-10-13 DIAGNOSIS — R000 Tachycardia, unspecified: Secondary | ICD-10-CM | POA: Diagnosis not present

## 2019-10-13 LAB — BASIC METABOLIC PANEL
Anion gap: 8 (ref 5–15)
BUN: 12 mg/dL (ref 8–23)
CO2: 29 mmol/L (ref 22–32)
Calcium: 9.6 mg/dL (ref 8.9–10.3)
Chloride: 97 mmol/L — ABNORMAL LOW (ref 98–111)
Creatinine, Ser: 0.77 mg/dL (ref 0.44–1.00)
GFR calc Af Amer: >60 mL/min (ref >60)
GFR calc non Af Amer: >60 mL/min (ref >60)
Glucose, Bld: 87 mg/dL (ref 70–99)
Potassium: 4.9 mmol/L (ref 3.5–5.1)
Sodium: 134 mmol/L — ABNORMAL LOW (ref 135–145)

## 2019-10-13 LAB — CBC WITH DIFFERENTIAL/PLATELET
Abs Immature Granulocytes: 0.03 10*3/uL (ref 0.00–0.07)
Basophils Absolute: 0 10*3/uL (ref 0.0–0.1)
Basophils Relative: 1 %
Eosinophils Absolute: 0.4 10*3/uL (ref 0.0–0.5)
Eosinophils Relative: 5 %
HCT: 45.5 % (ref 36.0–46.0)
Hemoglobin: 15.4 g/dL — ABNORMAL HIGH (ref 12.0–15.0)
Immature Granulocytes: 0 %
Lymphocytes Relative: 20 %
Lymphs Abs: 1.4 10*3/uL (ref 0.7–4.0)
MCH: 32.6 pg (ref 26.0–34.0)
MCHC: 33.8 g/dL (ref 30.0–36.0)
MCV: 96.4 fL (ref 80.0–100.0)
Monocytes Absolute: 0.6 10*3/uL (ref 0.1–1.0)
Monocytes Relative: 8 %
Neutro Abs: 4.6 10*3/uL (ref 1.7–7.7)
Neutrophils Relative %: 66 %
Platelets: 296 10*3/uL (ref 150–400)
RBC: 4.72 MIL/uL (ref 3.87–5.11)
RDW: 12.3 % (ref 11.5–15.5)
WBC: 7 10*3/uL (ref 4.0–10.5)
nRBC: 0 % (ref 0.0–0.2)

## 2019-10-13 LAB — T4, FREE: Free T4: 1.27 ng/dL — ABNORMAL HIGH (ref 0.61–1.12)

## 2019-10-13 LAB — TSH: TSH: 0.657 u[IU]/mL (ref 0.350–4.500)

## 2019-10-13 LAB — SARS CORONAVIRUS 2 BY RT PCR (HOSPITAL ORDER, PERFORMED IN ~~LOC~~ HOSPITAL LAB): SARS Coronavirus 2: NEGATIVE

## 2019-10-13 MED ORDER — ALBUTEROL SULFATE HFA 108 (90 BASE) MCG/ACT IN AERS
4.0000 | INHALATION_SPRAY | Freq: Once | RESPIRATORY_TRACT | Status: AC
  Administered 2019-10-13: 4 via RESPIRATORY_TRACT
  Filled 2019-10-13: qty 6.7

## 2019-10-13 MED ORDER — SODIUM CHLORIDE 0.9 % IV SOLN
1.0000 g | INTRAVENOUS | Status: DC
  Administered 2019-10-13 – 2019-10-14 (×2): 1 g via INTRAVENOUS
  Filled 2019-10-13 (×2): qty 10

## 2019-10-13 MED ORDER — VITAMIN D 25 MCG (1000 UNIT) PO TABS
1000.0000 [IU] | ORAL_TABLET | Freq: Every day | ORAL | Status: DC
  Administered 2019-10-13 – 2019-10-16 (×4): 1000 [IU] via ORAL
  Filled 2019-10-13 (×7): qty 1

## 2019-10-13 MED ORDER — MAGNESIUM SULFATE 2 GM/50ML IV SOLN
2.0000 g | Freq: Once | INTRAVENOUS | Status: AC
  Administered 2019-10-13: 2 g via INTRAVENOUS
  Filled 2019-10-13: qty 50

## 2019-10-13 MED ORDER — METHYLPREDNISOLONE SODIUM SUCC 40 MG IJ SOLR
40.0000 mg | Freq: Four times a day (QID) | INTRAMUSCULAR | Status: AC
  Administered 2019-10-13 – 2019-10-14 (×4): 40 mg via INTRAVENOUS
  Filled 2019-10-13 (×4): qty 1

## 2019-10-13 MED ORDER — METOPROLOL TARTRATE 25 MG PO TABS
25.0000 mg | ORAL_TABLET | Freq: Two times a day (BID) | ORAL | Status: DC
  Administered 2019-10-13 – 2019-10-16 (×6): 25 mg via ORAL
  Filled 2019-10-13 (×6): qty 1

## 2019-10-13 MED ORDER — BUDESONIDE 0.5 MG/2ML IN SUSP
2.0000 mg | Freq: Four times a day (QID) | RESPIRATORY_TRACT | Status: DC
  Administered 2019-10-14: 2 mg via RESPIRATORY_TRACT
  Filled 2019-10-13: qty 8

## 2019-10-13 MED ORDER — TIOTROPIUM BROMIDE MONOHYDRATE 2.5 MCG/ACT IN AERS: 2.0000 | INHALATION_SPRAY | Freq: Every day | RESPIRATORY_TRACT | Status: DC

## 2019-10-13 MED ORDER — ALBUTEROL (5 MG/ML) CONTINUOUS INHALATION SOLN
10.0000 mg | INHALATION_SOLUTION | RESPIRATORY_TRACT | Status: AC
  Administered 2019-10-13: 10 mg via RESPIRATORY_TRACT
  Filled 2019-10-13: qty 20

## 2019-10-13 MED ORDER — IPRATROPIUM BROMIDE 0.02 % IN SOLN
0.5000 mg | Freq: Four times a day (QID) | RESPIRATORY_TRACT | Status: DC
  Administered 2019-10-13 – 2019-10-15 (×6): 0.5 mg via RESPIRATORY_TRACT
  Filled 2019-10-13 (×6): qty 2.5

## 2019-10-13 MED ORDER — PREDNISONE 20 MG PO TABS
40.0000 mg | ORAL_TABLET | Freq: Every day | ORAL | Status: DC
  Administered 2019-10-15 – 2019-10-16 (×2): 40 mg via ORAL
  Filled 2019-10-13 (×3): qty 2

## 2019-10-13 MED ORDER — AZITHROMYCIN 250 MG PO TABS
500.0000 mg | ORAL_TABLET | Freq: Once | ORAL | Status: AC
  Administered 2019-10-13: 500 mg via ORAL
  Filled 2019-10-13: qty 2

## 2019-10-13 MED ORDER — BUSPIRONE HCL 5 MG PO TABS
7.5000 mg | ORAL_TABLET | Freq: Two times a day (BID) | ORAL | Status: DC
  Administered 2019-10-13 – 2019-10-16 (×6): 7.5 mg via ORAL
  Filled 2019-10-13 (×6): qty 2

## 2019-10-13 MED ORDER — METHYLPREDNISOLONE SODIUM SUCC 125 MG IJ SOLR
125.0000 mg | Freq: Once | INTRAMUSCULAR | Status: AC
  Administered 2019-10-13: 125 mg via INTRAVENOUS
  Filled 2019-10-13: qty 2

## 2019-10-13 MED ORDER — HEPARIN SODIUM (PORCINE) 5000 UNIT/ML IJ SOLN
5000.0000 [IU] | Freq: Three times a day (TID) | INTRAMUSCULAR | Status: DC
  Administered 2019-10-13 – 2019-10-16 (×8): 5000 [IU] via SUBCUTANEOUS
  Filled 2019-10-13 (×8): qty 1

## 2019-10-13 MED ORDER — LEVALBUTEROL HCL 0.63 MG/3ML IN NEBU
0.6300 mg | INHALATION_SOLUTION | Freq: Four times a day (QID) | RESPIRATORY_TRACT | Status: DC
  Administered 2019-10-13 – 2019-10-14 (×5): 0.63 mg via RESPIRATORY_TRACT
  Filled 2019-10-13 (×5): qty 3
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
## 2019-10-13 NOTE — ED Provider Notes (Signed)
This patient is a pleasant 74 year old female, recently admitted to the hospital in May 2021, has known COPD, does not require home oxygen.  She had followed up with her family doctor and was told that she no longer needed the oxygen that she required during her last admission.  She has done well until recently and over the last several days has had increasing coughing and shortness of breath and severe dyspnea on exertion.  On exam the patient is extremely tight, she is moving very little air but able to speak in short sentences.  She has bilateral diffuse symmetrical expiratory wheezing without rales, oxygen dropped to 85% when ambulating and the patient became severely tachypneic.  Despite continuous nebulizer treatments, steroids, antibiotics the patient has not improved to the point where she is stable to go home thus I think she needs to be admitted to the hospital to get more treatments steroids and bronchodilators.  The patient is agreeable to the plan, paged hospitalist at 6:10 PM  Discussed with Dr. Randol Kern who will admit the patient to the hospital, appreciate his willingness to take care of this very sick patient   Eber Hong, MD 10/13/19 1815
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
## 2019-10-13 NOTE — ED Notes (Signed)
Attempted report x1. 
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
## 2019-10-13 NOTE — ED Notes (Signed)
Ambulated pt. 75 ft. Pts. O2 sats dropped to 85 and pts. RR was 32.

## 2019-10-13 NOTE — ED Triage Notes (Addendum)
Pt reports sob x2-3 days. Pt denies pain. Pt last used neb 0500 and spiriva 1000. Non productive cough. Hx of copd

## 2019-10-13 NOTE — Plan of Care (Signed)

## 2019-10-14 DIAGNOSIS — J9601 Acute respiratory failure with hypoxia: Secondary | ICD-10-CM

## 2019-10-14 DIAGNOSIS — R7989 Other specified abnormal findings of blood chemistry: Secondary | ICD-10-CM

## 2019-10-14 DIAGNOSIS — J441 Chronic obstructive pulmonary disease with (acute) exacerbation: Secondary | ICD-10-CM

## 2019-10-14 DIAGNOSIS — K219 Gastro-esophageal reflux disease without esophagitis: Secondary | ICD-10-CM

## 2019-10-14 LAB — CBC
HCT: 41.6 % (ref 36.0–46.0)
Hemoglobin: 13.9 g/dL (ref 12.0–15.0)
MCH: 32 pg (ref 26.0–34.0)
MCHC: 33.4 g/dL (ref 30.0–36.0)
MCV: 95.9 fL (ref 80.0–100.0)
Platelets: 282 10*3/uL (ref 150–400)
RBC: 4.34 MIL/uL (ref 3.87–5.11)
RDW: 12.5 % (ref 11.5–15.5)
WBC: 13.7 10*3/uL — ABNORMAL HIGH (ref 4.0–10.5)
nRBC: 0 % (ref 0.0–0.2)

## 2019-10-14 LAB — BASIC METABOLIC PANEL
Anion gap: 11 (ref 5–15)
BUN: 16 mg/dL (ref 8–23)
CO2: 24 mmol/L (ref 22–32)
Calcium: 9.3 mg/dL (ref 8.9–10.3)
Chloride: 97 mmol/L — ABNORMAL LOW (ref 98–111)
Creatinine, Ser: 0.78 mg/dL (ref 0.44–1.00)
GFR calc Af Amer: >60 mL/min (ref >60)
GFR calc non Af Amer: >60 mL/min (ref >60)
Glucose, Bld: 126 mg/dL — ABNORMAL HIGH (ref 70–99)
Potassium: 4.3 mmol/L (ref 3.5–5.1)
Sodium: 132 mmol/L — ABNORMAL LOW (ref 135–145)

## 2019-10-14 LAB — HIV ANTIBODY (ROUTINE TESTING W REFLEX): HIV Screen 4th Generation wRfx: NONREACTIVE

## 2019-10-14 MED ORDER — BUDESONIDE 0.5 MG/2ML IN SUSP
2.0000 mg | Freq: Three times a day (TID) | RESPIRATORY_TRACT | Status: DC
  Administered 2019-10-15: 2 mg via RESPIRATORY_TRACT
  Filled 2019-10-14: qty 8

## 2019-10-14 MED ORDER — LEVALBUTEROL HCL 0.63 MG/3ML IN NEBU
0.6300 mg | INHALATION_SOLUTION | Freq: Three times a day (TID) | RESPIRATORY_TRACT | Status: DC
  Administered 2019-10-15 – 2019-10-16 (×5): 0.63 mg via RESPIRATORY_TRACT
  Filled 2019-10-14 (×5): qty 3

## 2019-10-14 MED ORDER — ADULT MULTIVITAMIN W/MINERALS CH
1.0000 | ORAL_TABLET | Freq: Every day | ORAL | Status: DC
  Administered 2019-10-14 – 2019-10-16 (×3): 1 via ORAL
  Filled 2019-10-14 (×4): qty 1

## 2019-10-14 MED ORDER — BUDESONIDE 0.5 MG/2ML IN SUSP
2.0000 mg | Freq: Four times a day (QID) | RESPIRATORY_TRACT | Status: DC
  Administered 2019-10-14 (×3): 2 mg via RESPIRATORY_TRACT
  Filled 2019-10-14 (×3): qty 8

## 2019-10-14 MED ORDER — ENSURE ENLIVE PO LIQD
237.0000 mL | Freq: Two times a day (BID) | ORAL | Status: DC
  Administered 2019-10-15 (×2): 237 mL via ORAL

## 2019-10-14 NOTE — Progress Notes (Signed)
OT Cancellation Note  Patient Details Name: Abigail Evans MRN: [id number] DOB: Apr 06, 1946   Cancelled Treatment:    Reason Eval/Treat Not Completed: OT screened, no needs identified, will sign off. Pt performing ADLs and mobility tasks independently, pt able to verbalize energy conservation strategies, no further OT services required at this time.    Ezra Sites, OTR/L  [phone] 10/14/2019, 8:42 AM

## 2019-10-14 NOTE — Plan of Care (Signed)

## 2019-10-14 NOTE — Plan of Care (Signed)
  Problem: Acute Rehab PT Goals(only PT should resolve) Goal: Pt Will Transfer Bed To Chair/Chair To Bed Outcome: Progressing Flowsheets (Taken 10/14/2019 0843) Pt will Transfer Bed to Chair/Chair to Bed: Independently Goal: Pt Will Ambulate Outcome: Progressing Flowsheets (Taken 10/14/2019 0843) Pt will Ambulate:  > 125 feet  with supervision Goal: Pt/caregiver will Perform Home Exercise Program Outcome: Progressing Flowsheets (Taken 10/14/2019 0843) Pt/caregiver will Perform Home Exercise Program:  For increased strengthening  For improved balance  Independently  8:44 AM, 10/14/19 Wyman Songster PT, DPT Physical Therapist at [facility]

## 2019-10-14 NOTE — Care Management Important Message (Signed)
Important Message  Patient Details  Name: Abigail Evans MRN: [id number] Date of Birth: 22-Apr-1945   Medicare Important Message Given:  Yes     Corey Harold 10/14/2019, 2:40 PM

## 2019-10-14 NOTE — Progress Notes (Signed)
Initial Nutrition Assessment  DOCUMENTATION CODES:   Not applicable  INTERVENTION:  Ensure Enlive po BID, each supplement provides 350 kcal and 20 grams of protein (chocolate) CIB po daily on breakfast tray, supplement with 237 whole milk provides 237 kcal and 13 grams of protein MVI with minerals daily    NUTRITION DIAGNOSIS:   Inadequate oral intake related to chronic illness, acute illness (acute exacerbation of COPD) as evidenced by  (meal completion 50% insufficient to meet increased needs).    GOAL:   Patient will meet greater than or equal to 90% of their needs  MONITOR:   Labs, Supplement acceptance, PO intake, Weight trends  REASON FOR ASSESSMENT:   Consult Assessment of nutrition requirement/status  ASSESSMENT:  RD working remotely.  74 year old female with past medical history of COPD, GERD, history of SVT presented with 4 day history of SOB, cough productive of sputum and wheezing admitted for acute hypoxic respiratory failure due to COPD exacerbation and bronchiectasis.  Patient with poor meal intake, per flowsheets she has consumed 50% x 2 documented intakes today. Spoke with patient via phone, she recalls having a muffin with juice and milk this morning, reports eating a small salad for dinner. Patient reports she does not care for facility food, states that she just eats what she likes on the tray. She reports having a good appetite at home, recalls pinto beans, cornbread, greens, drinks CIB daily. Patient likes Ensure supplements, will provide chocolate BID as well as CIB on breakfast tray.   Current wt 143.88 lb, limited recent wt history, per chart stable over the past month. Noted 132-136 lb over the past 8 years.  UBW: 147 lb per pt report  Medications reviewed and include: D3, Lopressor, Prednisone IVPB: Rocephin Labs: Na 132 (L), WBC 13.7 (H)  NUTRITION - FOCUSED PHYSICAL EXAM: Unable to complete at this time, RD working remotely.  Diet Order:    Diet Order            Diet Heart Room service appropriate? Yes; Fluid consistency: Thin  Diet effective now                 EDUCATION NEEDS:   Education needs have been addressed  Skin:  Skin Assessment: Reviewed RN Assessment  Last BM:  6/24  Height:   Ht Readings from Last 1 Encounters:  10/13/19 5\' 4"  (1.626 m)    Weight:   Wt Readings from Last 1 Encounters:  10/13/19 65.4 kg    BMI:  Body mass index is 24.75 kg/m.  Estimated Nutritional Needs:   Kcal:  1800-2000  Protein:  90-100  Fluid:  >/= 1.7 L/day   10/15/19, RD, LDN Clinical Nutrition After Hours/Weekend Pager # in Amion

## 2019-10-14 NOTE — Evaluation (Signed)
Physical Therapy Evaluation Patient Details Name: Abigail Evans MRN: [id number] DOB: 1946-02-03 Today's Date: 10/14/2019   History of Present Illness  Abigail Evans  is a 74 y.o. female, COPD, GERD, recent hospitalization May, where she was discharged with home oxygen, but this was discontinued by as she did not require any further oxygen, patient was seen by cardiology recently given sinus tachycardia/SVT during recent hospitalization, no further work-up indicated per cardiology given normal echo and BNP, with abnormal TSH felt contributing to her symptoms.-Bradycardia secondary to complaints of shortness of breath, and wheezing, over last 4 days, as well reports cough, with a productive sputum, the symptoms resembles recent hospitalization, she was seen recently by cardiologist where she was resumed back on her Lopressor, she reports her dyspnea and wheezing preceding the initiation of her metoprolol, she denies hemoptysis, fever, chills, nausea, vomiting, chest pain and dizziness, patient reports she did not receive her COVID-19 vaccine.    Clinical Impression  Patient limited for functional mobility as stated below secondary to BLE weakness, fatigue and impaired activity tolerance. Patient does not require assist for bed mobility or transfers. She does demonstrate minimal unsteadiness upon standing initially and during gait. Patient given min guard assist while ambulating for safety and balance. She is mostly limited by fatigue. O2 sat monitored throughout session, O2 sat at rest on room air 88-92% which decreases to 83-87% with ambulation on room air. Patient demonstrates good sitting tolerance today and is also instructed on pursed lip breathing to improve SOB and low O2 sat. Patient ended session seated in chair. Patient will benefit from continued physical therapy in hospital and recommended venue below to increase strength, balance, endurance for safe ADLs and gait.     Follow Up Recommendations  Home health PT    Equipment Recommendations  None recommended by PT    Recommendations for Other Services       Precautions / Restrictions Precautions Precautions: Fall Restrictions Weight Bearing Restrictions: No      Mobility  Bed Mobility Overal bed mobility: Modified Independent             General bed mobility comments: to transition to seated EOB without assist, slightly slow  Transfers Overall transfer level: Modified independent Equipment used: None             General transfer comment: slightly slow, min unsteadiness upon standing  Ambulation/Gait Ambulation/Gait assistance: Min guard Gait Distance (Feet): 120 Feet Assistive device: None Gait Pattern/deviations: Step-through pattern;Decreased step length - right;Decreased step length - left;Decreased stride length Gait velocity: decreased   General Gait Details: min guard for safety/balance, minimally decreased cadence; O2 sat at rest on room air 88-92% which decreases to 83-87% with ambulation  Stairs            Wheelchair Mobility    Modified Rankin (Stroke Patients Only)       Balance Overall balance assessment: Needs assistance Sitting-balance support: No upper extremity supported;Feet supported Sitting balance-Leahy Scale: Good Sitting balance - Comments: seated EOB   Standing balance support: No upper extremity supported;During functional activity Standing balance-Leahy Scale: Fair Standing balance comment: good/fair without AD                             Pertinent Vitals/Pain Pain Assessment: No/denies pain    Home Living Family/patient expects to be discharged to:: Private residence Living Arrangements: Alone Available Help at Discharge: Family;Available PRN/intermittently Type of Home: Apartment Home Access: Stairs  to enter Entrance Stairs-Rails: Right;Left;Can reach both Entrance Stairs-Number of Steps: 3   Home Equipment: Walker - 2 wheels       Prior Function Level of Independence: Independent         Comments: Patient states community ambulator without AD, Drives     Hand Dominance        Extremity/Trunk Assessment   Upper Extremity Assessment Upper Extremity Assessment: Defer to OT evaluation    Lower Extremity Assessment Lower Extremity Assessment: Overall WFL for tasks assessed       Communication   Communication: No difficulties  Cognition Arousal/Alertness: Awake/alert Behavior During Therapy: WFL for tasks assessed/performed Overall Cognitive Status: Within Functional Limits for tasks assessed                                        General Comments      Exercises     Assessment/Plan    PT Assessment Patient needs continued PT services  PT Problem List Decreased strength;Decreased mobility;Cardiopulmonary status limiting activity;Decreased activity tolerance;Decreased balance       PT Treatment Interventions DME instruction;Therapeutic exercise;Gait training;Balance training;Stair training;Neuromuscular re-education;Functional mobility training;Therapeutic activities;Patient/family education    PT Goals (Current goals can be found in the Care Plan section)  Acute Rehab PT Goals Patient Stated Goal: return home PT Goal Formulation: With patient Time For Goal Achievement: 10/21/19 Potential to Achieve Goals: Good    Frequency Min 3X/week   Barriers to discharge        Co-evaluation               AM-PAC PT "6 Clicks" Mobility  Outcome Measure Help needed turning from your back to your side while in a flat bed without using bedrails?: None Help needed moving from lying on your back to sitting on the side of a flat bed without using bedrails?: None Help needed moving to and from a bed to a chair (including a wheelchair)?: None Help needed standing up from a chair using your arms (e.g., wheelchair or bedside chair)?: None Help needed to walk in hospital room?: A  Little Help needed climbing 3-5 steps with a railing? : A Little 6 Click Score: 22    End of Session   Activity Tolerance: Patient tolerated treatment well;Patient limited by fatigue Patient left: in chair;with call bell/phone within reach Nurse Communication: Mobility status PT Visit Diagnosis: Unsteadiness on feet (R26.81);Other abnormalities of gait and mobility (R26.89);Muscle weakness (generalized) (M62.81)    Time: 0102-7253 PT Time Calculation (min) (ACUTE ONLY): 18 min   Charges:   PT Evaluation $PT Eval Low Complexity: 1 Low PT Treatments $Therapeutic Activity: 8-22 mins        8:42 AM, 10/14/19 Mearl Latin PT, DPT Physical Therapist at [facility]

## 2019-10-14 NOTE — Progress Notes (Signed)
PROGRESS NOTE    Abigail Evans  XTK:[id number] DOB: [birth year] DOA: 10/13/2019 PCP: Elfredia Nevins, MD (Confirm with patient/family/NH records and if not entered, this HAS to be entered at [facility] point of entry. "No PCP" if truly none.)   Chief Complaint  Patient presents with  . Shortness of Breath    Brief Narrative:  As per H&P written by Dr. Randol Kern on 10/13/2019  Abigail Evans  is a 74 y.o. female, COPD, GERD, recent hospitalization May, where she was discharged with home oxygen, but this was discontinued by as she did not require any further oxygen, patient was seen by cardiology recently given sinus tachycardia/SVT during recent hospitalization, no further work-up indicated per cardiology given normal echo and BNP, with abnormal TSH felt contributing to her symptoms. -Bradycardia secondary to complaints of shortness of breath, and wheezing, over last 4 days, as well reports cough, with a productive sputum, the symptoms resembles recent hospitalization, she was seen recently by cardiologist where she was resumed back on her Lopressor, she reports her dyspnea and wheezing preceding the initiation of her metoprolol, she denies hemoptysis, fever, chills, nausea, vomiting, chest pain and dizziness, patient reports she did not receive her COVID-19 vaccine. -In ED she was significantly tachypneic on presentation, she received IV steroids, and continues nebulizer treatment, magnesium sulfate, and despite that she remains hypoxic 85% on room air with activity, tachypneic as well, chest x-ray with no evidence of infection, [facility] hospitalist was consulted to admit.  Assessment & Plan: 1-acute hypoxic respiratory failure due to COPD exacerbation and bronchiectasis -Patient 85% on room air especially with ambulation -Good O2 sat maintained on 2 L nasal cannula supplementation. -Still short of breath with exertion and having difficulty speaking in full sentences -On auscultation diffuse wheezing and  decreased air movement bilaterally. -Continue with steroids management, continue Rocephin, continue bronchodilators, Spiriva and Pulmicort. -Follow clinical response -Encouraged to use flutter valve and incentive spirometer -Patient will benefit of outpatient evaluation by pulmonologist for PFTs and further adjustment to maintenance therapy.    2-GERD (gastroesophageal reflux disease) -Continue PPI  3-abnormal thyroid panel results -Elevated free T4 and normal TSH appreciated currently -Subclinical hyperthyroidism most likely. -I will Recommend outpatient follow-up with endocrinology service. -no thyromegaly seen  4-history of SVT -Continue monitoring on telemetry -Continue the use of Xopenex as -Nebulizer management for COPD exacerbation. -Currently rate controlled. -Continue metoprolol.   DVT prophylaxis: Heparin Code Status: Full code. Family Communication: No family at bedside. Disposition:   Status is: Inpatient  Dispo: The patient is from: Home              Anticipated d/c is to: Home with home health services              Anticipated d/c date is: 10/16/19              Patient currently is not medically stable for discharge still having difficulty speaking in full sentences, experiencing shortness of breath with activity and requiring oxygen supplementation.  Diffuse wheezing on auscultation along with decreased air movement bilaterally.  Continue steroids management, nebulizer treatment and antibiotics.       Consultants:  None   Procedures:  See below for x-ray reports.   Antimicrobials: Rocephin    Subjective: While at rest no acute distress and feeling slightly better than before coming to the hospital.  Still short of breath with exertion, demonstrating hypoxia requiring oxygen supplementation.  Diffuse wheezing bilaterally and having difficulty speaking in full sentences.  Objective: Vitals:  10/14/19 0832 10/14/19 1250 10/14/19 1401 10/14/19 1458    BP:   (!) 108/47   Pulse:   96   Resp:   18   Temp:   98.3 F (36.8 C)   TempSrc:      SpO2: 97% 96% 97% 96%  Weight:      Height:        Intake/Output Summary (Last 24 hours) at 10/14/2019 1737 Last data filed at 10/14/2019 1300 Gross per 24 hour  Intake 698.94 ml  Output 750 ml  Net -51.06 ml   Filed Weights   10/13/19 1134 10/13/19 2038  Weight: 59.4 kg 65.4 kg    Examination:  General exam: Appears calm and comfortable at rest; having difficulty speaking in full sentences, short of breath with activity, requiring oxygen supplementation and demonstrating hypoxia with exertion. Respiratory system: Positive rhonchi bilaterally; expiratory wheezing and decreased air movement. Cardiovascular system: S1 and S2, no rubs, no gallops, no JVD. Gastrointestinal system: Abdomen is nondistended, soft and nontender. No organomegaly or masses felt. Normal bowel sounds heard. Central nervous system: Alert and oriented. No focal neurological deficits. Extremities: No cyanosis or clubbing; no edema. Skin: No rashes, no petechiae. Psychiatry: Judgement and insight appear normal. Mood & affect appropriate.     Data Reviewed: I have personally reviewed following labs and imaging studies  CBC: Recent Labs  Lab 10/13/19 1342 10/14/19 0538  WBC 7.0 13.7*  NEUTROABS 4.6  --   HGB 15.4* 13.9  HCT 45.5 41.6  MCV 96.4 95.9  PLT 296 282    Basic Metabolic Panel: Recent Labs  Lab 10/13/19 1342 10/14/19 0538  NA 134* 132*  K 4.9 4.3  CL 97* 97*  CO2 29 24  GLUCOSE 87 126*  BUN 12 16  CREATININE 0.77 0.78  CALCIUM 9.6 9.3    GFR: Estimated Creatinine Clearance: 53.3 mL/min (by C-G formula based on SCr of 0.78 mg/dL).   Recent Results (from the past 240 hour(s))  SARS Coronavirus 2 by RT PCR (hospital order, performed in [facility] hospital lab) Nasopharyngeal Nasopharyngeal Swab     Status: None   Collection Time: 10/13/19  2:40 PM   Specimen: Nasopharyngeal Swab   Result Value Ref Range Status   SARS Coronavirus 2 NEGATIVE NEGATIVE Final    Comment: (NOTE) SARS-CoV-2 target nucleic acids are NOT DETECTED.  The SARS-CoV-2 RNA is generally detectable in upper and lower respiratory specimens during the acute phase of infection. The lowest concentration of SARS-CoV-2 viral copies this assay can detect is 250 copies / mL. A negative result does not preclude SARS-CoV-2 infection and should not be used as the sole basis for treatment or other patient management decisions.  A negative result may occur with improper specimen collection / handling, submission of specimen other than nasopharyngeal swab, presence of viral mutation(s) within the areas targeted by this assay, and inadequate number of viral copies (<250 copies / mL). A negative result must be combined with clinical observations, patient history, and epidemiological information.  Fact Sheet for Patients:   BoilerBrush.com.cy  Fact Sheet for Healthcare Providers: https://pope.com/  This test is not yet approved or  cleared by the [place] FDA and has been authorized for detection and/or diagnosis of SARS-CoV-2 by FDA under an Emergency Use Authorization (EUA).  This EUA will remain in effect (meaning this test can be used) for the duration of the COVID-19 declaration under Section 564(b)(1) of the Act, 21 U.S.C. section 360bbb-3(b)(1), unless the authorization is terminated or revoked  sooner.  Performed at [facility], [address] [place], [place] [address]      Radiology Studies: DG Chest Portable 1 View  Result Date: 10/13/2019 CLINICAL DATA:  Shortness of breath. EXAM: PORTABLE CHEST 1 VIEW COMPARISON:  Sep 08, 2019. FINDINGS: The heart size and mediastinal contours are within normal limits. No pneumothorax or pleural effusion is noted. Emphysematous disease is noted in both upper lobes. No acute pulmonary disease is noted. The  visualized skeletal structures are unremarkable. IMPRESSION: No active disease. Emphysema (ICD10-J43.9). Electronically Signed   By: Marijo Conception M.D.   On: 10/13/2019 12:28    Scheduled Meds: . budesonide (PULMICORT) nebulizer solution  2 mg Nebulization Q6H  . busPIRone  7.5 mg Oral BID  . cholecalciferol  1,000 Units Oral Daily  . heparin  5,000 Units Subcutaneous Q8H  . ipratropium  0.5 mg Nebulization QID  . levalbuterol  0.63 mg Nebulization Q6H  . metoprolol tartrate  25 mg Oral BID  . [START ON 10/15/2019] predniSONE  40 mg Oral Q breakfast   Continuous Infusions: . cefTRIAXone (ROCEPHIN)  IV Stopped (10/13/19 2204)     LOS: 1 day    Time spent: 30 minutes.    Barton Dubois, MD [facility]   To contact the attending provider between 7A-7P or the covering provider during after hours 7P-7A, please log into the web site [email or web address] and access using universal [place] password for that web site. If you do not have the password, please call the hospital operator.  10/14/2019, 5:37 PM

## 2019-10-14 NOTE — TOC Initial Note (Signed)
Transition of Care [facility]) - Initial/Assessment Note    Patient Details  Name: Abigail Evans MRN: [id number] Date of Birth: [birth year]  Transition of Care [facility]) CM/SW Contact:    Leitha Bleak, RN Phone Number: 10/14/2019, 10:49 AM  Clinical Narrative:       Patient admitted with acute respiratory failure with hypoxia. PT note qualifies patient for oxygen. MD is ordering.  Patient used oxygen for 2 weeks after last discharge, oxygen provided by [employer]. [facility] accepted the referral and will deliver at discharge.             Expected Discharge Plan: Home w Home Health Services Barriers to Discharge: Continued Medical Work up   Patient Goals and CMS Choice Patient states their goals for this hospitalization and ongoing recovery are:: to go home. CMS Medicare.gov Compare Post Acute Care list provided to:: Patient Choice offered to / list presented to : Patient  Expected Discharge Plan and Services Expected Discharge Plan: Home w Home Health Services       Living arrangements for the past 2 months: Single Family Home                 DME Arranged: 3-N-1, Oxygen DME Agency: [facility] Date DME Agency Contacted: 10/14/19 Time DME Agency Contacted: 1022 Representative spoke with at DME Agency: [facility] HH Arranged: PT HH Agency: [facility] Date HH Agency Contacted: 10/14/19 Time HH Agency Contacted: 1047 Representative spoke with at [facility] Agency: Alroy Bailiff  Prior Living Arrangements/Services Living arrangements for the past 2 months: Single Family Home Lives with:: Spouse   Do you feel safe going back to the place where you live?: Yes        Activities of Daily Living Home Assistive Devices/Equipment: Eyeglasses ADL Screening (condition at time of admission) Patient's cognitive ability adequate to safely complete daily activities?: Yes Is the patient deaf or have difficulty hearing?: No Does the patient have difficulty seeing, even when wearing  glasses/contacts?: No Does the patient have difficulty concentrating, remembering, or making decisions?: No Patient able to express need for assistance with ADLs?: Yes Does the patient have difficulty dressing or bathing?: No Independently performs ADLs?: Yes (appropriate for developmental age) Does the patient have difficulty walking or climbing stairs?: No Weakness of Legs: None Weakness of Arms/Hands: None  Permission Sought/Granted     Emotional Assessment     Affect (typically observed): Accepting, Pleasant Orientation: : Oriented to Self, Oriented to Place, Oriented to  Time, Oriented to Situation Alcohol / Substance Use: Not Applicable Psych Involvement: No (comment)  Admission diagnosis:  Acute respiratory failure (HCC) [J96.00] Acute respiratory distress [R06.03] COPD exacerbation (HCC) [J44.1] Patient Active Problem List   Diagnosis Date Noted   Acute respiratory failure (HCC) 10/13/2019   Abnormal TSH 10/13/2019   Acute respiratory failure with hypoxia (HCC) 09/10/2019   SVT (supraventricular tachycardia) (HCC) 09/08/2019   COPD exacerbation (HCC) 07/23/2017   Osteopenia after menopause 07/23/2017   Tachycardia 07/23/2017   Arthritis 07/23/2017   Hypoxia 07/23/2017   Hemoptysis 08/17/2016   Pneumonia 08/17/2016   GERD (gastroesophageal reflux disease) 12/02/2011   PCP:  Elfredia Nevins, MD Pharmacy:   [facility] - [place], [place] - [address] [place] [address] Phone: [phone] Fax: [phone]

## 2019-10-15 MED ORDER — BUDESONIDE 0.5 MG/2ML IN SUSP
1.0000 mg | Freq: Two times a day (BID) | RESPIRATORY_TRACT | Status: DC
  Administered 2019-10-15 – 2019-10-16 (×2): 1 mg via RESPIRATORY_TRACT
  Filled 2019-10-15 (×2): qty 4

## 2019-10-15 MED ORDER — IPRATROPIUM BROMIDE 0.02 % IN SOLN
0.5000 mg | Freq: Three times a day (TID) | RESPIRATORY_TRACT | Status: DC
  Administered 2019-10-15 – 2019-10-16 (×4): 0.5 mg via RESPIRATORY_TRACT
  Filled 2019-10-15 (×4): qty 2.5

## 2019-10-15 MED ORDER — CEFDINIR 300 MG PO CAPS
300.0000 mg | ORAL_CAPSULE | Freq: Two times a day (BID) | ORAL | Status: DC
  Administered 2019-10-15 – 2019-10-16 (×3): 300 mg via ORAL
  Filled 2019-10-15 (×3): qty 1

## 2019-10-15 NOTE — Plan of Care (Signed)

## 2019-10-15 NOTE — Progress Notes (Signed)
PROGRESS NOTE    Abigail Evans  HFW:[id number] DOB: Sep 16, 1945 DOA: 10/13/2019 PCP: Elfredia Nevins, MD (Confirm with patient/family/NH records and if not entered, this HAS to be entered at [facility] point of entry. "No PCP" if truly none.)   Chief Complaint  Patient presents with  . Shortness of Breath    Brief Narrative:  As per H&P written by Dr. Randol Kern on 10/13/2019  Abigail Evans  is a 74 y.o. female, COPD, GERD, recent hospitalization May, where she was discharged with home oxygen, but this was discontinued by as she did not require any further oxygen, patient was seen by cardiology recently given sinus tachycardia/SVT during recent hospitalization, no further work-up indicated per cardiology given normal echo and BNP, with abnormal TSH felt contributing to her symptoms. -Bradycardia secondary to complaints of shortness of breath, and wheezing, over last 4 days, as well reports cough, with a productive sputum, the symptoms resembles recent hospitalization, she was seen recently by cardiologist where she was resumed back on her Lopressor, she reports her dyspnea and wheezing preceding the initiation of her metoprolol, she denies hemoptysis, fever, chills, nausea, vomiting, chest pain and dizziness, patient reports she did not receive her COVID-19 vaccine. -In ED she was significantly tachypneic on presentation, she received IV steroids, and continues nebulizer treatment, magnesium sulfate, and despite that she remains hypoxic 85% on room air with activity, tachypneic as well, chest x-ray with no evidence of infection, [facility] hospitalist was consulted to admit.  Assessment & Plan: 1-acute hypoxic respiratory failure due to COPD exacerbation and bronchiectasis -Patient 85% on room air especially with ambulation -Good O2 sat maintained on 2 L nasal cannula supplementation. -Still short of breath with exertion and having difficulty speaking in full sentences -On auscultation diffuse wheezing and  decreased air movement bilaterally. -Continue with steroids management, continue antibiotics (will switch to cefdinir), continue bronchodilators, Spiriva and Pulmicort. -Follow clinical response -Encouraged to use flutter valve and incentive spirometer -Patient will benefit of outpatient evaluation by pulmonologist for PFTs and further adjustment to maintenance therapy.    2-GERD (gastroesophageal reflux disease) -Continue PPI  3-abnormal thyroid panel results -Elevated free T4 and normal TSH appreciated currently -Subclinical hyperthyroidism most likely. -I will Recommend outpatient follow-up with endocrinology service. -no thyromegaly seen  4-history of SVT -Continue monitoring on telemetry -Continue the use of Xopenex as -Nebulizer management for COPD exacerbation. -Currently rate controlled. -Continue metoprolol.   DVT prophylaxis: Heparin Code Status: Full code. Family Communication: No family at bedside. Disposition:   Status is: Inpatient  Dispo: The patient is from: Home              Anticipated d/c is to: Home with home health services              Anticipated d/c date is: 10/16/19              Patient currently is not medically stable for discharge still having difficulty speaking in full sentences, experiencing shortness of breath with activity and requiring oxygen supplementation.  Diffuse wheezing on auscultation along with decreased air movement bilaterally.  Continue steroids management, nebulizer treatment and antibiotics.       Consultants:  None   Procedures:  See below for x-ray reports.   Antimicrobials: Rocephin    Subjective: Reports some improvement in her breathing overall.  Still short of breath with activity, mild difficulty speaking in full sentences, requiring 2 L oxygen supplementation on exertion and having diffuse expiratory wheezing.  Objective: Vitals:   10/15/19  4680 10/15/19 0726 10/15/19 0732 10/15/19 1309  BP: 126/61       Pulse: 89     Resp: 18     Temp: 98.6 F (37 C)     TempSrc: Oral     SpO2: 93% 93% 93% 96%  Weight:      Height:        Intake/Output Summary (Last 24 hours) at 10/15/2019 1500 Last data filed at 10/15/2019 0800 Gross per 24 hour  Intake 449.68 ml  Output 700 ml  Net -250.32 ml   Filed Weights   10/13/19 1134 10/13/19 2038  Weight: 59.4 kg 65.4 kg    Examination: General exam: Alert, awake, oriented x 3, reporting some improvement in her breathing; still short of breath with activity and with diffuse wheezing.  2 L nasal cannula supplementation on exertion.  Patient found with mild difficulty speaking in full sentences. Respiratory system: Fair air movement bilaterally, no using accessory muscles; positive diffuse expiratory wheezing.  No crackles Cardiovascular system:RRR. No murmurs, rubs, gallops.  No JVD. Gastrointestinal system: Abdomen is nondistended, soft and nontender. No organomegaly or masses felt. Normal bowel sounds heard. Central nervous system: Alert and oriented. No focal neurological deficits. Extremities: No cyanosis or clubbing. Skin: No rashes, no petechiae. Psychiatry: Judgement and insight appear normal. Mood & affect appropriate.    Data Reviewed: I have personally reviewed following labs and imaging studies  CBC: Recent Labs  Lab 10/13/19 1342 10/14/19 0538  WBC 7.0 13.7*  NEUTROABS 4.6  --   HGB 15.4* 13.9  HCT 45.5 41.6  MCV 96.4 95.9  PLT 296 282    Basic Metabolic Panel: Recent Labs  Lab 10/13/19 1342 10/14/19 0538  NA 134* 132*  K 4.9 4.3  CL 97* 97*  CO2 29 24  GLUCOSE 87 126*  BUN 12 16  CREATININE 0.77 0.78  CALCIUM 9.6 9.3    GFR: Estimated Creatinine Clearance: 53.3 mL/min (by C-G formula based on SCr of 0.78 mg/dL).   Recent Results (from the past 240 hour(s))  SARS Coronavirus 2 by RT PCR (hospital order, performed in [facility] hospital lab) Nasopharyngeal Nasopharyngeal Swab     Status: None   Collection  Time: 10/13/19  2:40 PM   Specimen: Nasopharyngeal Swab  Result Value Ref Range Status   SARS Coronavirus 2 NEGATIVE NEGATIVE Final    Comment: (NOTE) SARS-CoV-2 target nucleic acids are NOT DETECTED.  The SARS-CoV-2 RNA is generally detectable in upper and lower respiratory specimens during the acute phase of infection. The lowest concentration of SARS-CoV-2 viral copies this assay can detect is 250 copies / mL. A negative result does not preclude SARS-CoV-2 infection and should not be used as the sole basis for treatment or other patient management decisions.  A negative result may occur with improper specimen collection / handling, submission of specimen other than nasopharyngeal swab, presence of viral mutation(s) within the areas targeted by this assay, and inadequate number of viral copies (<250 copies / mL). A negative result must be combined with clinical observations, patient history, and epidemiological information.  Fact Sheet for Patients:   BoilerBrush.com.cy  Fact Sheet for Healthcare Providers: https://pope.com/  This test is not yet approved or  cleared by the [place] FDA and has been authorized for detection and/or diagnosis of SARS-CoV-2 by FDA under an Emergency Use Authorization (EUA).  This EUA will remain in effect (meaning this test can be used) for the duration of the COVID-19 declaration under Section 564(b)(1) of the  Act, 21 U.S.C. section 360bbb-3(b)(1), unless the authorization is terminated or revoked sooner.  Performed at [facility], [address] [place], [place] [address]      Radiology Studies: No results found.  Scheduled Meds: . budesonide (PULMICORT) nebulizer solution  1 mg Nebulization BID  . busPIRone  7.5 mg Oral BID  . cholecalciferol  1,000 Units Oral Daily  . feeding supplement (ENSURE ENLIVE)  237 mL Oral BID BM  . heparin  5,000 Units Subcutaneous Q8H  . ipratropium  0.5  mg Nebulization TID  . levalbuterol  0.63 mg Nebulization TID  . metoprolol tartrate  25 mg Oral BID  . multivitamin with minerals  1 tablet Oral Daily  . predniSONE  40 mg Oral Q breakfast   Continuous Infusions: . cefTRIAXone (ROCEPHIN)  IV Stopped (10/14/19 2224)     LOS: 2 days    Time spent: 30 minutes.    Barton Dubois, MD [facility]   To contact the attending provider between 7A-7P or the covering provider during after hours 7P-7A, please log into the web site [email or web address] and access using universal [place] password for that web site. If you do not have the password, please call the hospital operator.  10/15/2019, 3:00 PM

## 2019-10-16 DIAGNOSIS — I471 Supraventricular tachycardia: Secondary | ICD-10-CM

## 2019-10-16 MED ORDER — SACCHAROMYCES BOULARDII 250 MG PO CAPS
250.0000 mg | ORAL_CAPSULE | Freq: Two times a day (BID) | ORAL | 0 refills | Status: AC
Start: 2019-10-16 — End: 2019-10-26

## 2019-10-16 MED ORDER — BUDESONIDE-FORMOTEROL FUMARATE 160-4.5 MCG/ACT IN AERO
2.0000 | INHALATION_SPRAY | Freq: Two times a day (BID) | RESPIRATORY_TRACT | 3 refills | Status: DC
Start: 2019-10-16 — End: 2023-03-23

## 2019-10-16 MED ORDER — PREDNISONE 20 MG PO TABS: ORAL_TABLET | ORAL | 0 refills | Status: DC

## 2019-10-16 MED ORDER — CEFDINIR 300 MG PO CAPS: 300.0000 mg | ORAL_CAPSULE | Freq: Two times a day (BID) | ORAL | 0 refills | Status: AC

## 2019-10-16 NOTE — TOC Transition Note (Signed)
Transition of Care [facility]) - CM/SW Discharge Note   Patient Details  Name: Abigail Evans MRN: [id number] Date of Birth: [birth year]  [facility] CM/SW Contact:  Barry Brunner, LCSW Phone Number: 10/16/2019, 4:56 PM   Clinical Narrative:    CSW informed [facility] of Pt discharge and provided copy of HH orders. CSW established O2 with [employer] due to unavailability of [employer] during weekend. [employer] agreeable to providing home O2. TOC signing off.    Final next level of care: Home w Home Health Services Barriers to Discharge: Continued Medical Work up   Patient Goals and CMS Choice Patient states their goals for this hospitalization and ongoing recovery are:: to go home. CMS Medicare.gov Compare Post Acute Care list provided to:: Patient Choice offered to / list presented to : Patient  Discharge Placement                       Discharge Plan and Services                DME Arranged: 3-N-1, Oxygen DME Agency: Patsy Lager Date DME Agency Contacted: 10/14/19 Time DME Agency Contacted: 1022 Representative spoke with at DME Agency: [facility] HH Arranged: PT HH Agency: [facility] Date HH Agency Contacted: 10/14/19 Time HH Agency Contacted: 1047 Representative spoke with at [facility] Agency: Alroy Bailiff  Social Determinants of Health (SDOH) Interventions     Readmission Risk Interventions No flowsheet data found.

## 2019-10-16 NOTE — Progress Notes (Signed)
SATURATION QUALIFICATIONS: (This note is used to comply with regulatory documentation for home oxygen)  Patient Saturations on Room Air at Rest = 98%  Patient Saturations on Room Air while Ambulating = 88%

## 2019-10-16 NOTE — Discharge Summary (Signed)
Physician Discharge Summary  Abigail Evans ZOX:[id number] DOB: 1945/08/01 DOA: 10/13/2019  PCP: Elfredia Nevins, MD  Admit date: 10/13/2019 Discharge date: 10/16/2019  Time spent: 35 minutes  Recommendations for Outpatient Follow-up:  1. Repeat basic metabolic panel to follow across renal function 2. Make sure patient has follow-up with pulmonologist for further evaluation and management of her COPD (she is in need of PFTs and adjustment of maintenance therapy).   Discharge Diagnoses:  Active Problems:   GERD (gastroesophageal reflux disease)   COPD exacerbation (HCC)   Atrial tachycardia (HCC)   Acute respiratory failure with hypoxia (HCC)   Acute respiratory failure (HCC)   Abnormal TSH   Discharge Condition: Stable and improved.  Discharged home with instruction to follow-up with PCP and pulmonology service as an outpatient.  CODE STATUS: Full code  Diet recommendation: Heart healthy diet.  Filed Weights   10/13/19 1134 10/13/19 2038  Weight: 59.4 kg 65.4 kg    History of present illness:  As per H&P written by Dr. Randol Kern on 10/13/2019 [name] a74 y.o.female,COPD, GERD, recent hospitalization May, where she was discharged with home oxygen, but this was discontinued by as she did not require any further oxygen, patient was seen by cardiology recently given sinus tachycardia/SVT during recent hospitalization, no further work-up indicated per cardiology given normal echo and BNP, with abnormal TSH felt contributing to her symptoms. -Bradycardia secondary to complaints of shortness of breath, and wheezing, over last 4 days, as well reports cough, with a productive sputum, the symptoms resembles recent hospitalization, she was seen recently by cardiologist where she was resumed back on her Lopressor, she reports her dyspnea and wheezing preceding the initiation of her metoprolol, she denies hemoptysis, fever, chills, nausea, vomiting, chest pain and dizziness, patient  reports she did not receive her COVID-19 vaccine. -In ED she was significantly tachypneic on presentation, she received IV steroids, and continues nebulizer treatment, magnesium sulfate, and despite that she remains hypoxic 85% on room air with activity, tachypneic as well, chest x-ray with no evidence of infection,[facility]ist was consulted to admit.  Hospital Course:  1-acute hypoxic respiratory failure due to COPD exacerbation and bronchiectasis -Patient 85% on room air especially with ambulation -Good O2 sat maintained on 2 L nasal cannula supplementation. -Improved air movement bilaterally, very little expiratory wheezing at time of discharge and able to speak in full sentences. -Continue with steroids tapering, start symbicort BID and continue daily spiriva. Patient will also continue albuterol rescue bronchodilator management and will complete antibiotics (cefdinir) as instructed. -Patient will benefit of outpatient evaluation by pulmonologist for PFTs and further adjustment to maintenance therapy.    2-GERD (gastroesophageal reflux disease) -Continue PRN PPI -no reflux symptoms reported.  3-abnormal thyroid panel results -Elevated free T4 and normal TSH appreciated currently -Subclinical hyperthyroidism most likely. -Repeat thyroid panel in 4 weeks and if needed will Recommend outpatient follow-up with endocrinology service. -no thyromegaly seen  4-history of SVT -Continue outpatient follow-up with cardiology service. -Currently rate controlled. -Continue metoprolol. -Patient denies palpitations.  Procedures: See below for x-ray reports.  Consultations:  None  Discharge Exam: Vitals:   10/16/19 0720 10/16/19 0735  BP:    Pulse:    Resp:    Temp:    SpO2: 93% 93%    General: Speaking in full sentences, no complaining of chest pain, no fever, no nausea, no vomiting.  Reports significant improvement in her breathing.  Demonstrated the need of oxygen  supplementation while doing exertion. Cardiovascular: S1 and S2, no rubs, no  gallops, no JVD Respiratory: Improved air movement bilaterally, very little end expiratory wheezing on auscultation bilaterally; no crackles, no using accessory muscles, no tachypnea. Abdomen: soft, NT, ND, positive BS Extremities: no cyanosis, no clubbing  Discharge Instructions   Discharge Instructions    Diet - low sodium heart healthy   Complete by: As directed    Discharge instructions   Complete by: As directed    Take medications as prescribed Maintain adequate hydration Remember to properly rinse mouth after using inhalers Follow heart healthy diet Follow-up with pulmonologist as instructed Follow-up with PCP in 10 days for hospital follow up.     Allergies as of 10/16/2019      Reactions   Doxycycline Nausea And Vomiting      Medication List    TAKE these medications   albuterol 108 (90 Base) MCG/ACT inhaler Commonly known as: VENTOLIN HFA Inhale 1 puff into the lungs 4 (four) times daily. 1 puff every 4 to 6 hours as nedded   budesonide-formoterol 160-4.5 MCG/ACT inhaler Commonly known as: Symbicort Inhale 2 puffs into the lungs in the morning and at bedtime.   busPIRone 7.5 MG tablet Commonly known as: BUSPAR Take 7.5 mg by mouth 2 (two) times daily.   cefdinir 300 MG capsule Commonly known as: OMNICEF Take 1 capsule (300 mg total) by mouth every 12 (twelve) hours for 2 doses.   metoprolol tartrate 25 MG tablet Commonly known as: LOPRESSOR Take 1 tablet (25 mg total) by mouth 2 (two) times daily.   predniSONE 20 MG tablet Commonly known as: DELTASONE Take 2 tablet by mouth daily x1 day; then 1 tablet by mouth daily x3 days; then half tablet by mouth daily x3 days and stop prednisone.   saccharomyces boulardii 250 MG capsule Commonly known as: Florastor Take 1 capsule (250 mg total) by mouth 2 (two) times daily for 10 days.   Spiriva Respimat 2.5 MCG/ACT Aers Generic drug:  Tiotropium Bromide Monohydrate Take 2 puffs by mouth daily.   VITAMIN D3 PO Take 1 tablet by mouth daily.            Durable Medical Equipment  (From admission, onward)         Start     Ordered   10/14/19 1110  For home use only DME oxygen  Once       Comments: Evaluate for concerving device, POC or best fit portability.  Question Answer Comment  Length of Need 12 Months   Mode or (Route) Nasal cannula   Liters per Minute 2   Frequency Continuous (stationary and portable oxygen unit needed)   Oxygen conserving device Yes   Oxygen delivery system Gas      10/14/19 1111         Allergies  Allergen Reactions   Doxycycline Nausea And Vomiting    Follow-up Information    [facility] Follow up.   Specialty: Home Health Services Why:   PT       [place] Follow up.   Why:  Home Oxygen       Redmond School, MD. Schedule an appointment as soon as possible for a visit in 10 day(s).   Specialty: Internal Medicine Contact information: [address] [place] [address] [phone]        Josue Hector, MD .   Specialty: Cardiology Contact information: [phone] [address] [place] [address] [phone]        Rigoberto Noel, MD. Schedule an  appointment as soon as possible for a visit in 2 week(s).   Specialty: Pulmonary Disease Why: appointment is for [facility] office Contact information: [address] [place] [address] [phone]               The results of significant diagnostics from this hospitalization (including imaging, microbiology, ancillary and laboratory) are listed below for reference.    Significant Diagnostic Studies: DG Chest Portable 1 View  Result Date: 10/13/2019 CLINICAL DATA:  Shortness of breath. EXAM: PORTABLE CHEST 1 VIEW COMPARISON:  Sep 08, 2019. FINDINGS: The heart size and mediastinal contours are within normal limits. No pneumothorax or  pleural effusion is noted. Emphysematous disease is noted in both upper lobes. No acute pulmonary disease is noted. The visualized skeletal structures are unremarkable. IMPRESSION: No active disease. Emphysema (ICD10-J43.9). Electronically Signed   By: Lupita Raider M.D.   On: 10/13/2019 12:28    Microbiology: Recent Results (from the past 240 hour(s))  SARS Coronavirus 2 by RT PCR (hospital order, performed in [facility] hospital lab) Nasopharyngeal Nasopharyngeal Swab     Status: None   Collection Time: 10/13/19  2:40 PM   Specimen: Nasopharyngeal Swab  Result Value Ref Range Status   SARS Coronavirus 2 NEGATIVE NEGATIVE Final    Comment: (NOTE) SARS-CoV-2 target nucleic acids are NOT DETECTED.  The SARS-CoV-2 RNA is generally detectable in upper and lower respiratory specimens during the acute phase of infection. The lowest concentration of SARS-CoV-2 viral copies this assay can detect is 250 copies / mL. A negative result does not preclude SARS-CoV-2 infection and should not be used as the sole basis for treatment or other patient management decisions.  A negative result may occur with improper specimen collection / handling, submission of specimen other than nasopharyngeal swab, presence of viral mutation(s) within the areas targeted by this assay, and inadequate number of viral copies (<250 copies / mL). A negative result must be combined with clinical observations, patient history, and epidemiological information.  Fact Sheet for Patients:   BoilerBrush.com.cy  Fact Sheet for Healthcare Providers: https://pope.com/  This test is not yet approved or  cleared by the [place] FDA and has been authorized for detection and/or diagnosis of SARS-CoV-2 by FDA under an Emergency Use Authorization (EUA).  This EUA will remain in effect (meaning this test can be used) for the duration of the COVID-19 declaration under Section  564(b)(1) of the Act, 21 U.S.C. section 360bbb-3(b)(1), unless the authorization is terminated or revoked sooner.  Performed at [facility], [address] [place], [place] [address]      Labs: Basic Metabolic Panel: Recent Labs  Lab 10/13/19 1342 10/14/19 0538  NA 134* 132*  K 4.9 4.3  CL 97* 97*  CO2 29 24  GLUCOSE 87 126*  BUN 12 16  CREATININE 0.77 0.78  CALCIUM 9.6 9.3   CBC: Recent Labs  Lab 10/13/19 1342 10/14/19 0538  WBC 7.0 13.7*  NEUTROABS 4.6  --   HGB 15.4* 13.9  HCT 45.5 41.6  MCV 96.4 95.9  PLT 296 282    BNP (last 3 results) Recent Labs    09/08/19 0554  BNP 19.0    Signed:  Vassie Loll MD.  [facility] 10/16/2019, 12:58 PM

## 2019-10-16 NOTE — Progress Notes (Signed)
Nsg Discharge Note  Admit Date:  10/13/2019 Discharge date: 10/16/2019   Selinda Orion to be D/C'd Home per MD order.  AVS completed.  Copy for chart, and copy for patient signed, and dated. Patient/caregiver able to verbalize understanding.  Discharge Medication: Allergies as of 10/16/2019      Reactions   Doxycycline Nausea And Vomiting      Medication List    TAKE these medications   albuterol 108 (90 Base) MCG/ACT inhaler Commonly known as: VENTOLIN HFA Inhale 1 puff into the lungs 4 (four) times daily. 1 puff every 4 to 6 hours as nedded   budesonide-formoterol 160-4.5 MCG/ACT inhaler Commonly known as: Symbicort Inhale 2 puffs into the lungs in the morning and at bedtime.   busPIRone 7.5 MG tablet Commonly known as: BUSPAR Take 7.5 mg by mouth 2 (two) times daily.   cefdinir 300 MG capsule Commonly known as: OMNICEF Take 1 capsule (300 mg total) by mouth every 12 (twelve) hours for 2 doses.   metoprolol tartrate 25 MG tablet Commonly known as: LOPRESSOR Take 1 tablet (25 mg total) by mouth 2 (two) times daily.   predniSONE 20 MG tablet Commonly known as: DELTASONE Take 2 tablet by mouth daily x1 day; then 1 tablet by mouth daily x3 days; then half tablet by mouth daily x3 days and stop prednisone.   saccharomyces boulardii 250 MG capsule Commonly known as: Florastor Take 1 capsule (250 mg total) by mouth 2 (two) times daily for 10 days.   Spiriva Respimat 2.5 MCG/ACT Aers Generic drug: Tiotropium Bromide Monohydrate Take 2 puffs by mouth daily.   VITAMIN D3 PO Take 1 tablet by mouth daily.            Durable Medical Equipment  (From admission, onward)         Start     Ordered   10/14/19 1110  For home use only DME oxygen  Once       Comments: Evaluate for concerving device, POC or best fit portability.  Question Answer Comment  Length of Need 12 Months   Mode or (Route) Nasal cannula   Liters per Minute 2   Frequency Continuous (stationary and  portable oxygen unit needed)   Oxygen conserving device Yes   Oxygen delivery system Gas      10/14/19 1111          Discharge Assessment: Vitals:   10/16/19 0720 10/16/19 0735  BP:    Pulse:    Resp:    Temp:    SpO2: 93% 93%   Skin clean, dry and intact without evidence of skin break down, no evidence of skin tears noted. IV catheter discontinued intact. Site without signs and symptoms of complications - no redness or edema noted at insertion site, patient denies c/o pain - only slight tenderness at site.  Dressing with slight pressure applied.  D/c Instructions-Education: Discharge instructions given to patient/family with verbalized understanding. D/c education completed with patient/family including follow up instructions, medication list, d/c activities limitations if indicated, with other d/c instructions as indicated by MD - patient able to verbalize understanding, all questions fully answered. Patient instructed to return to ED, call 911, or call MD for any changes in condition.  Patient escorted via WC, and D/C home via private auto.  Andria Rhein, RN 10/16/2019 12:52 PM

## 2019-10-17 DIAGNOSIS — I471 Supraventricular tachycardia: Secondary | ICD-10-CM | POA: Diagnosis not present

## 2019-10-17 DIAGNOSIS — R946 Abnormal results of thyroid function studies: Secondary | ICD-10-CM | POA: Diagnosis not present

## 2019-10-17 DIAGNOSIS — J9601 Acute respiratory failure with hypoxia: Secondary | ICD-10-CM | POA: Diagnosis not present

## 2019-10-17 DIAGNOSIS — F17210 Nicotine dependence, cigarettes, uncomplicated: Secondary | ICD-10-CM | POA: Diagnosis not present

## 2019-10-17 DIAGNOSIS — M1990 Unspecified osteoarthritis, unspecified site: Secondary | ICD-10-CM | POA: Diagnosis not present

## 2019-10-17 DIAGNOSIS — Z602 Problems related to living alone: Secondary | ICD-10-CM | POA: Diagnosis not present

## 2019-10-17 DIAGNOSIS — K219 Gastro-esophageal reflux disease without esophagitis: Secondary | ICD-10-CM | POA: Diagnosis not present

## 2019-10-17 DIAGNOSIS — J479 Bronchiectasis, uncomplicated: Secondary | ICD-10-CM | POA: Diagnosis not present

## 2019-10-17 DIAGNOSIS — Z7951 Long term (current) use of inhaled steroids: Secondary | ICD-10-CM | POA: Diagnosis not present

## 2019-10-17 DIAGNOSIS — Z9981 Dependence on supplemental oxygen: Secondary | ICD-10-CM | POA: Diagnosis not present

## 2019-10-19 DIAGNOSIS — M810 Age-related osteoporosis without current pathological fracture: Secondary | ICD-10-CM | POA: Diagnosis not present

## 2019-10-19 DIAGNOSIS — J449 Chronic obstructive pulmonary disease, unspecified: Secondary | ICD-10-CM | POA: Diagnosis not present

## 2019-10-19 DIAGNOSIS — Z87891 Personal history of nicotine dependence: Secondary | ICD-10-CM | POA: Diagnosis not present

## 2019-10-19 DIAGNOSIS — G894 Chronic pain syndrome: Secondary | ICD-10-CM | POA: Diagnosis not present

## 2019-10-20 DIAGNOSIS — J479 Bronchiectasis, uncomplicated: Secondary | ICD-10-CM | POA: Diagnosis not present

## 2019-10-20 DIAGNOSIS — J9601 Acute respiratory failure with hypoxia: Secondary | ICD-10-CM | POA: Diagnosis not present

## 2019-10-20 DIAGNOSIS — Z9981 Dependence on supplemental oxygen: Secondary | ICD-10-CM | POA: Diagnosis not present

## 2019-10-20 DIAGNOSIS — Z602 Problems related to living alone: Secondary | ICD-10-CM | POA: Diagnosis not present

## 2019-10-20 DIAGNOSIS — K219 Gastro-esophageal reflux disease without esophagitis: Secondary | ICD-10-CM | POA: Diagnosis not present

## 2019-10-20 DIAGNOSIS — F17210 Nicotine dependence, cigarettes, uncomplicated: Secondary | ICD-10-CM | POA: Diagnosis not present

## 2019-10-20 DIAGNOSIS — R946 Abnormal results of thyroid function studies: Secondary | ICD-10-CM | POA: Diagnosis not present

## 2019-10-20 DIAGNOSIS — Z7951 Long term (current) use of inhaled steroids: Secondary | ICD-10-CM | POA: Diagnosis not present

## 2019-10-20 DIAGNOSIS — I471 Supraventricular tachycardia: Secondary | ICD-10-CM | POA: Diagnosis not present

## 2019-10-20 DIAGNOSIS — M1990 Unspecified osteoarthritis, unspecified site: Secondary | ICD-10-CM | POA: Diagnosis not present

## 2019-10-26 DIAGNOSIS — J441 Chronic obstructive pulmonary disease with (acute) exacerbation: Secondary | ICD-10-CM | POA: Diagnosis not present

## 2019-10-26 DIAGNOSIS — R0603 Acute respiratory distress: Secondary | ICD-10-CM | POA: Diagnosis not present

## 2019-10-26 DIAGNOSIS — Z6824 Body mass index (BMI) 24.0-24.9, adult: Secondary | ICD-10-CM | POA: Diagnosis not present

## 2019-10-26 DIAGNOSIS — J449 Chronic obstructive pulmonary disease, unspecified: Secondary | ICD-10-CM | POA: Diagnosis not present

## 2019-11-15 DIAGNOSIS — J449 Chronic obstructive pulmonary disease, unspecified: Secondary | ICD-10-CM | POA: Diagnosis not present

## 2019-12-07 DIAGNOSIS — H43813 Vitreous degeneration, bilateral: Secondary | ICD-10-CM | POA: Diagnosis not present

## 2019-12-16 DIAGNOSIS — J449 Chronic obstructive pulmonary disease, unspecified: Secondary | ICD-10-CM | POA: Diagnosis not present

## 2019-12-20 DIAGNOSIS — M810 Age-related osteoporosis without current pathological fracture: Secondary | ICD-10-CM | POA: Diagnosis not present

## 2019-12-20 DIAGNOSIS — G894 Chronic pain syndrome: Secondary | ICD-10-CM | POA: Diagnosis not present

## 2019-12-20 DIAGNOSIS — J449 Chronic obstructive pulmonary disease, unspecified: Secondary | ICD-10-CM | POA: Diagnosis not present

## 2019-12-20 DIAGNOSIS — Z87891 Personal history of nicotine dependence: Secondary | ICD-10-CM | POA: Diagnosis not present

## 2019-12-23 ENCOUNTER — Encounter: Payer: Self-pay | Admitting: Student

## 2019-12-23 ENCOUNTER — Encounter: Payer: Self-pay | Admitting: *Deleted

## 2019-12-23 ENCOUNTER — Ambulatory Visit: Payer: PPO | Admitting: Student

## 2019-12-23 ENCOUNTER — Other Ambulatory Visit: Payer: Self-pay

## 2019-12-23 VITALS — BP 109/63 | HR 73 | Ht 65.0 in | Wt 141.0 lb

## 2019-12-23 DIAGNOSIS — R000 Tachycardia, unspecified: Secondary | ICD-10-CM | POA: Diagnosis not present

## 2019-12-23 DIAGNOSIS — R7989 Other specified abnormal findings of blood chemistry: Secondary | ICD-10-CM

## 2019-12-23 DIAGNOSIS — J449 Chronic obstructive pulmonary disease, unspecified: Secondary | ICD-10-CM

## 2019-12-23 NOTE — Patient Instructions (Signed)
Medication Instructions:  Your physician recommends that you continue on your current medications as directed. Please refer to the Current Medication list given to you today.  *If you need a refill on your cardiac medications before your next appointment, please call your pharmacy*   Lab Work: NONE  If you have labs (blood work) drawn today and your tests are completely normal, you will receive your results only by: MyChart Message (if you have MyChart) OR A paper copy in the mail If you have any lab test that is abnormal or we need to change your treatment, we will call you to review the results.   Testing/Procedures: NONE    Follow-Up: At [facility], you and your health needs are our priority.  As part of our continuing mission to provide you with exceptional heart care, we have created designated Provider Care Teams.  These Care Teams include your primary Cardiologist (physician) and Advanced Practice Providers (APPs -  Physician Assistants and Nurse Practitioners) who all work together to provide you with the care you need, when you need it.  We recommend signing up for the patient portal called "MyChart".  Sign up information is provided on this After Visit Summary.  MyChart is used to connect with patients for Virtual Visits (Telemedicine).  Patients are able to view lab/test results, encounter notes, upcoming appointments, etc.  Non-urgent messages can be sent to your provider as well.   To learn more about what you can do with MyChart, go to [email or web address].    Your next appointment:   6 month(s)  The format for your next appointment:   In Person  Provider:   Peter Nishan, MD   Other Instructions Thank you for choosing [facility]!    
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
## 2020-01-03 ENCOUNTER — Ambulatory Visit: Payer: PPO | Admitting: Student

## 2020-01-16 DIAGNOSIS — J449 Chronic obstructive pulmonary disease, unspecified: Secondary | ICD-10-CM | POA: Diagnosis not present

## 2020-01-19 DIAGNOSIS — F4542 Pain disorder with related psychological factors: Secondary | ICD-10-CM | POA: Diagnosis not present

## 2020-01-19 DIAGNOSIS — J449 Chronic obstructive pulmonary disease, unspecified: Secondary | ICD-10-CM | POA: Diagnosis not present

## 2020-01-19 DIAGNOSIS — Z87891 Personal history of nicotine dependence: Secondary | ICD-10-CM | POA: Diagnosis not present

## 2020-01-19 DIAGNOSIS — G894 Chronic pain syndrome: Secondary | ICD-10-CM | POA: Diagnosis not present

## 2020-01-23 ENCOUNTER — Other Ambulatory Visit (HOSPITAL_COMMUNITY): Payer: Self-pay | Admitting: Internal Medicine

## 2020-01-23 DIAGNOSIS — Z1231 Encounter for screening mammogram for malignant neoplasm of breast: Secondary | ICD-10-CM

## 2020-01-27 ENCOUNTER — Other Ambulatory Visit: Payer: Self-pay

## 2020-01-27 ENCOUNTER — Ambulatory Visit (HOSPITAL_COMMUNITY)
Admission: RE | Admit: 2020-01-27 | Discharge: 2020-01-27 | Disposition: A | Discharge status: Home / Self Care | Payer: PPO | Source: Ambulatory Visit | Attending: Internal Medicine | Admitting: Internal Medicine

## 2020-01-27 DIAGNOSIS — Z1231 Encounter for screening mammogram for malignant neoplasm of breast: Secondary | ICD-10-CM | POA: Diagnosis not present

## 2020-02-06 DIAGNOSIS — Z6824 Body mass index (BMI) 24.0-24.9, adult: Secondary | ICD-10-CM | POA: Diagnosis not present

## 2020-02-06 DIAGNOSIS — Z23 Encounter for immunization: Secondary | ICD-10-CM | POA: Diagnosis not present

## 2020-02-06 DIAGNOSIS — J449 Chronic obstructive pulmonary disease, unspecified: Secondary | ICD-10-CM | POA: Diagnosis not present

## 2020-02-06 DIAGNOSIS — K219 Gastro-esophageal reflux disease without esophagitis: Secondary | ICD-10-CM | POA: Diagnosis not present

## 2020-02-06 DIAGNOSIS — L03116 Cellulitis of left lower limb: Secondary | ICD-10-CM | POA: Diagnosis not present

## 2020-02-15 DIAGNOSIS — J449 Chronic obstructive pulmonary disease, unspecified: Secondary | ICD-10-CM | POA: Diagnosis not present

## 2020-02-18 DIAGNOSIS — Z87891 Personal history of nicotine dependence: Secondary | ICD-10-CM | POA: Diagnosis not present

## 2020-02-18 DIAGNOSIS — G894 Chronic pain syndrome: Secondary | ICD-10-CM | POA: Diagnosis not present

## 2020-02-18 DIAGNOSIS — J449 Chronic obstructive pulmonary disease, unspecified: Secondary | ICD-10-CM | POA: Diagnosis not present

## 2020-02-18 DIAGNOSIS — F4542 Pain disorder with related psychological factors: Secondary | ICD-10-CM | POA: Diagnosis not present

## 2020-03-01 DIAGNOSIS — H01001 Unspecified blepharitis right upper eyelid: Secondary | ICD-10-CM | POA: Diagnosis not present

## 2020-03-01 DIAGNOSIS — H02831 Dermatochalasis of right upper eyelid: Secondary | ICD-10-CM | POA: Diagnosis not present

## 2020-03-01 DIAGNOSIS — H01005 Unspecified blepharitis left lower eyelid: Secondary | ICD-10-CM | POA: Diagnosis not present

## 2020-03-01 DIAGNOSIS — H25812 Combined forms of age-related cataract, left eye: Secondary | ICD-10-CM | POA: Diagnosis not present

## 2020-03-01 DIAGNOSIS — H01002 Unspecified blepharitis right lower eyelid: Secondary | ICD-10-CM | POA: Diagnosis not present

## 2020-03-01 DIAGNOSIS — H01004 Unspecified blepharitis left upper eyelid: Secondary | ICD-10-CM | POA: Diagnosis not present

## 2020-03-01 DIAGNOSIS — H02834 Dermatochalasis of left upper eyelid: Secondary | ICD-10-CM | POA: Diagnosis not present

## 2020-03-05 DIAGNOSIS — Z1211 Encounter for screening for malignant neoplasm of colon: Secondary | ICD-10-CM | POA: Diagnosis not present

## 2020-03-17 DIAGNOSIS — J449 Chronic obstructive pulmonary disease, unspecified: Secondary | ICD-10-CM | POA: Diagnosis not present

## 2020-04-16 DIAGNOSIS — J449 Chronic obstructive pulmonary disease, unspecified: Secondary | ICD-10-CM | POA: Diagnosis not present

## 2020-04-19 NOTE — H&P (Signed)
Surgical History & Physical  Patient Name: Abigail Evans DOB: [birth year]  Surgery: Cataract extraction with intraocular lens implant phacoemulsification; Left Eye  Surgeon: Abigail Pierce MD Surgery Date:  04/27/2020 Pre-Op Date:  04/18/2020  HPI: A 32 Yr. old female patient is referred by Dr Abigail Evans for cataract eval. 1. 1. The patient complains of difficulty when seeing street signs, which began 6 months ago. Both eyes are affected. The episode is gradual. The condition's severity increased since last visit. Symptoms occur when the patient is driving, inside and outside. This is negatively affecting the patient's quality of life. HPI was performed by Abigail Evans .  Medical History: Cataracts Vitreous floaters Lung Problems Spinal Stenosis  Review of Systems Negative Allergic/Immunologic Negative Cardiovascular Negative Constitutional Negative Ear, Nose, Mouth & Throat Negative Endocrine Negative Eyes Negative Gastrointestinal Negative Genitourinary Negative Hemotologic/Lymphatic Negative Integumentary Negative Musculoskeletal Negative Neurological Negative Psychiatry Negative Respiratory  Social   Former smoker   Medication Spiriva, Calcium, Fish Oil,   Sx/Procedures  None  Drug Allergies   NKDA  History & Physical: Heent:  Cataract, Left eye NECK: supple without bruits LUNGS: lungs clear to auscultation CV: regular rate and rhythm Abdomen: soft and non-tender  Impression & Plan: Assessment: 1.  COMBINED FORMS AGE RELATED CATARACT; Left Eye (H25.812) 2.  BLEPHARITIS; Right Upper Lid, Right Lower Lid, Left Upper Lid, Left Lower Lid (H01.001, H01.002,H01.004,H01.005) 3.  DERMATOCHALASIS, no surgery; Right Upper Lid, Left Upper Lid (H02.831, K02.542)  Plan: 1.  Cataract accounts for the patient's decreased vision. This visual impairment is not correctable with a tolerable change in glasses or contact lenses. Cataract surgery with an implantation of a new lens  should significantly improve the visual and functional status of the patient. Discussed all risks, benefits, alternatives, and potential complications. Discussed the procedures and recovery. Patient desires to have surgery. A-scan ordered and performed today for intra-ocular lens calculations. The surgery will be performed in order to improve vision for driving, reading, and for eye examinations. Recommend phacoemulsification with intra-ocular lens. Recommend Dextenza for post-operative pain and inflammation. Left Eye. worse - first. Dilates well - shugarcaine by protocol. 2.  Begin/continue lid scrubs. 3.  Asymptomatic, recommend observation for now. Findings, prognosis and treatment options reviewed.

## 2020-04-20 DIAGNOSIS — F4542 Pain disorder with related psychological factors: Secondary | ICD-10-CM | POA: Diagnosis not present

## 2020-04-20 DIAGNOSIS — J449 Chronic obstructive pulmonary disease, unspecified: Secondary | ICD-10-CM | POA: Diagnosis not present

## 2020-04-20 DIAGNOSIS — M810 Age-related osteoporosis without current pathological fracture: Secondary | ICD-10-CM | POA: Diagnosis not present

## 2020-04-20 DIAGNOSIS — Z87891 Personal history of nicotine dependence: Secondary | ICD-10-CM | POA: Diagnosis not present

## 2020-04-23 DIAGNOSIS — H25812 Combined forms of age-related cataract, left eye: Secondary | ICD-10-CM | POA: Diagnosis not present

## 2020-04-24 ENCOUNTER — Encounter (HOSPITAL_COMMUNITY)
Admission: RE | Admit: 2020-04-24 | Discharge: 2020-04-24 | Disposition: A | Discharge status: Home / Self Care | Payer: PPO | Source: Ambulatory Visit | Attending: Ophthalmology | Admitting: Ophthalmology

## 2020-04-24 ENCOUNTER — Encounter (HOSPITAL_COMMUNITY): Payer: Self-pay

## 2020-04-24 ENCOUNTER — Other Ambulatory Visit: Payer: Self-pay

## 2020-04-25 ENCOUNTER — Other Ambulatory Visit (HOSPITAL_COMMUNITY)
Admission: RE | Admit: 2020-04-25 | Discharge: 2020-04-25 | Disposition: A | Discharge status: Home / Self Care | Payer: PPO | Source: Ambulatory Visit | Attending: Ophthalmology | Admitting: Ophthalmology

## 2020-04-25 ENCOUNTER — Other Ambulatory Visit: Payer: Self-pay

## 2020-04-25 DIAGNOSIS — Z20822 Contact with and (suspected) exposure to covid-19: Secondary | ICD-10-CM | POA: Diagnosis not present

## 2020-04-25 DIAGNOSIS — Z01812 Encounter for preprocedural laboratory examination: Secondary | ICD-10-CM | POA: Insufficient documentation

## 2020-04-26 LAB — SARS CORONAVIRUS 2 (TAT 6-24 HRS): SARS Coronavirus 2: NEGATIVE

## 2020-04-27 ENCOUNTER — Encounter (HOSPITAL_COMMUNITY)
Admission: RE | Disposition: A | Discharge status: Home / Self Care | Payer: Self-pay | Source: Home / Self Care | Attending: Ophthalmology

## 2020-04-27 ENCOUNTER — Ambulatory Visit (HOSPITAL_COMMUNITY): Payer: PPO | Admitting: Certified Registered"

## 2020-04-27 ENCOUNTER — Ambulatory Visit (HOSPITAL_COMMUNITY)
Admission: RE | Admit: 2020-04-27 | Discharge: 2020-04-27 | Disposition: A | Discharge status: Home / Self Care | Payer: PPO | Attending: Ophthalmology | Admitting: Ophthalmology

## 2020-04-27 ENCOUNTER — Encounter (HOSPITAL_COMMUNITY): Payer: Self-pay | Admitting: Ophthalmology

## 2020-04-27 DIAGNOSIS — H0100B Unspecified blepharitis left eye, upper and lower eyelids: Secondary | ICD-10-CM | POA: Diagnosis not present

## 2020-04-27 DIAGNOSIS — H25812 Combined forms of age-related cataract, left eye: Secondary | ICD-10-CM | POA: Diagnosis not present

## 2020-04-27 DIAGNOSIS — Z87891 Personal history of nicotine dependence: Secondary | ICD-10-CM | POA: Insufficient documentation

## 2020-04-27 HISTORY — PX: CATARACT EXTRACTION W/PHACO: SHX586

## 2020-04-27 SURGERY — PHACOEMULSIFICATION, CATARACT, WITH IOL INSERTION
Anesthesia: Monitor Anesthesia Care | Site: Eye | Laterality: Left

## 2020-04-27 MED ORDER — TETRACAINE HCL 0.5 % OP SOLN
1.0000 [drp] | OPHTHALMIC | Status: AC | PRN
  Administered 2020-04-27 (×3): 1 [drp] via OPHTHALMIC

## 2020-04-27 MED ORDER — LACTATED RINGERS IV SOLN: INTRAVENOUS | Status: DC

## 2020-04-27 MED ORDER — LIDOCAINE HCL 3.5 % OP GEL
1.0000 "application " | Freq: Once | OPHTHALMIC | Status: AC
  Administered 2020-04-27: 1 via OPHTHALMIC

## 2020-04-27 MED ORDER — STERILE WATER FOR IRRIGATION IR SOLN
Status: DC | PRN
  Administered 2020-04-27: 250 mL

## 2020-04-27 MED ORDER — PHENYLEPHRINE-KETOROLAC 1-0.3 % IO SOLN
INTRAOCULAR | Status: AC
  Filled 2020-04-27: qty 4

## 2020-04-27 MED ORDER — EPINEPHRINE PF 1 MG/ML IJ SOLN
INTRAOCULAR | Status: DC | PRN
  Administered 2020-04-27: 500 mL

## 2020-04-27 MED ORDER — LIDOCAINE HCL (PF) 1 % IJ SOLN
INTRAOCULAR | Status: DC | PRN
  Administered 2020-04-27: 1 mL via OPHTHALMIC

## 2020-04-27 MED ORDER — TROPICAMIDE 1 % OP SOLN
1.0000 [drp] | OPHTHALMIC | Status: AC
  Administered 2020-04-27 (×3): 1 [drp] via OPHTHALMIC

## 2020-04-27 MED ORDER — PROVISC 10 MG/ML IO SOLN
INTRAOCULAR | Status: DC | PRN
  Administered 2020-04-27: 0.85 mL via INTRAOCULAR

## 2020-04-27 MED ORDER — EPINEPHRINE PF 1 MG/ML IJ SOLN
INTRAMUSCULAR | Status: AC
  Filled 2020-04-27: qty 1

## 2020-04-27 MED ORDER — SODIUM HYALURONATE 23 MG/ML IO SOLN
INTRAOCULAR | Status: DC | PRN
  Administered 2020-04-27: 0.6 mL via INTRAOCULAR

## 2020-04-27 MED ORDER — BSS IO SOLN
INTRAOCULAR | Status: DC | PRN
  Administered 2020-04-27: 15 mL via INTRAOCULAR

## 2020-04-27 MED ORDER — POVIDONE-IODINE 5 % OP SOLN
OPHTHALMIC | Status: DC | PRN
  Administered 2020-04-27: 1 via OPHTHALMIC

## 2020-04-27 MED ORDER — NEOMYCIN-POLYMYXIN-DEXAMETH 3.5-10000-0.1 OP SUSP
OPHTHALMIC | Status: DC | PRN
  Administered 2020-04-27: 1 [drp] via OPHTHALMIC

## 2020-04-27 MED ORDER — PHENYLEPHRINE HCL 2.5 % OP SOLN
1.0000 [drp] | OPHTHALMIC | Status: AC | PRN
  Administered 2020-04-27 (×3): 1 [drp] via OPHTHALMIC

## 2020-04-27 SURGICAL SUPPLY — 16 items
CLOTH BEACON ORANGE TIMEOUT ST (SAFETY) ×2 IMPLANT
DEVICE MILOOP (MISCELLANEOUS) IMPLANT
EYE SHIELD UNIVERSAL CLEAR (GAUZE/BANDAGES/DRESSINGS) ×2 IMPLANT
GLOVE BIOGEL PI IND STRL 7.0 (GLOVE) ×2 IMPLANT
GLOVE BIOGEL PI INDICATOR 7.0 (GLOVE) ×2
MILOOP DEVICE (MISCELLANEOUS)
NEEDLE HYPO 18GX1.5 BLUNT FILL (NEEDLE) ×2 IMPLANT
PAD ARMBOARD 7.5X6 YLW CONV (MISCELLANEOUS) ×2 IMPLANT
RING MALYGIN (MISCELLANEOUS) IMPLANT
RING MALYGIN 7.0 (MISCELLANEOUS) IMPLANT
SYR TB 1ML LL NO SAFETY (SYRINGE) ×2 IMPLANT
TAPE SURG TRANSPORE 1 IN (GAUZE/BANDAGES/DRESSINGS) ×1 IMPLANT
TAPE SURGICAL TRANSPORE 1 IN (GAUZE/BANDAGES/DRESSINGS) ×2
TECNIS 1-PIECE IOL (Intraocular Lens) ×2 IMPLANT
VISCOELASTIC ADDITIONAL (OPHTHALMIC RELATED) IMPLANT
WATER STERILE IRR 250ML POUR (IV SOLUTION) ×2 IMPLANT

## 2020-04-27 NOTE — Discharge Instructions (Signed)
Please discharge patient when stable, will follow up today with Dr. Adalind Weitz at the [facility] [place] office immediately following discharge.  Leave shield in place until visit.  All paperwork with discharge instructions will be given at the office.  [facility] Address:  [address]  [place], [place] [address]  

## 2020-04-27 NOTE — Op Note (Signed)
Date of procedure: 04/27/20  Pre-operative diagnosis: Visually significant age-related combined cataract, Left Eye (H25.812)  Post-operative diagnosis: Visually significant age-related combined cataract, Left Eye (H25.812)  Procedure: Removal of cataract via phacoemulsification and insertion of intra-ocular lens Wynetta Emery and [place]  +21.5D into the capsular bag of the Left Eye  Attending surgeon: Gerda Diss. Latina Frank, MD, MA  Anesthesia: MAC, Topical Akten  Complications: None  Estimated Blood Loss: <2m (minimal)  Specimens: None  Implants: As above  Indications:  Visually significant age-related cataract, Left Eye  Procedure:  The patient was seen and identified in the pre-operative area. The operative eye was identified and dilated.  The operative eye was marked.  Topical anesthesia was administered to the operative eye.     The patient was then to the operative suite and placed in the supine position.  A timeout was performed confirming the patient, procedure to be performed, and all other relevant information.   The patient's face was prepped and draped in the usual fashion for intra-ocular surgery.  A lid speculum was placed into the operative eye and the surgical microscope moved into place and focused.  An inferotemporal paracentesis was created using a 20 gauge paracentesis blade.  Shugarcaine was injected into the anterior chamber.  Viscoelastic was injected into the anterior chamber.  A temporal clear-corneal main wound incision was created using a 2.471mmicrokeratome.  A continuous curvilinear capsulorrhexis was initiated using an irrigating cystitome and completed using capsulorrhexis forceps.  Hydrodissection and hydrodeliniation were performed.  Viscoelastic was injected into the anterior chamber.  A phacoemulsification handpiece and a chopper as a second instrument were used to remove the nucleus and epinucleus. The irrigation/aspiration handpiece was used to remove  any remaining cortical material.   The capsular bag was reinflated with viscoelastic, checked, and found to be intact.  The intraocular lens was inserted into the capsular bag.  The irrigation/aspiration handpiece was used to remove any remaining viscoelastic.  The clear corneal wound and paracentesis wounds were then hydrated and checked with Weck-Cels to be watertight.  The lid-speculum was removed.  The drape was removed.  The patient's face was cleaned with a wet and dry 4x4.   Maxitrol was instilled in the eye. A clear shield was taped over the eye. The patient was taken to the post-operative care unit in good condition, having tolerated the procedure well.  Post-Op Instructions: [facility] and will receive all other orders and instructions.

## 2020-04-27 NOTE — Anesthesia Procedure Notes (Signed)
Procedure Name: MAC Date/Time: 04/27/2020 11:11 AM Performed by: Orlie Dakin, CRNA Pre-anesthesia Checklist: Patient identified, Emergency Drugs available, Suction available and Patient being monitored Patient Re-evaluated:Patient Re-evaluated prior to induction Oxygen Delivery Method: Nasal cannula Placement Confirmation: positive ETCO2

## 2020-04-27 NOTE — Anesthesia Postprocedure Evaluation (Signed)
Anesthesia Post Note  Patient: Abigail Evans  Procedure(s) Performed: CATARACT EXTRACTION PHACO AND INTRAOCULAR LENS PLACEMENT LEFT EYE (Left Eye)  Patient location during evaluation: Phase II Anesthesia Type: MAC Level of consciousness: awake and alert and oriented Pain management: pain level controlled Vital Signs Assessment: post-procedure vital signs reviewed and stable Respiratory status: spontaneous breathing, nonlabored ventilation and respiratory function stable Cardiovascular status: blood pressure returned to baseline and stable Postop Assessment: no apparent nausea or vomiting Anesthetic complications: no   No complications documented.   Last Vitals:  Vitals:   04/27/20 1020  BP: 132/62  Pulse: 66  Resp: (!) 21  Temp: 36.9 C  SpO2: 97%    Last Pain:  Vitals:   04/27/20 1020  TempSrc: Oral  PainSc: 0-No pain                 Orlie Dakin

## 2020-04-27 NOTE — Anesthesia Preprocedure Evaluation (Signed)
Anesthesia Evaluation  Patient identified by MRN, date of birth, ID band Patient awake    Reviewed: Allergy & Precautions, H&P , NPO status , Patient's Chart, lab work & pertinent test results, reviewed documented beta blocker date and time   Airway Mallampati: II  TM Distance: >3 FB Neck ROM: full    Dental no notable dental hx.    Pulmonary pneumonia, resolved, COPD, former smoker,    Pulmonary exam normal breath sounds clear to auscultation       Cardiovascular Exercise Tolerance: Good negative cardio ROS   Rhythm:regular Rate:Normal     Neuro/Psych negative neurological ROS  negative psych ROS   GI/Hepatic Neg liver ROS, GERD  Medicated,  Endo/Other  negative endocrine ROS  Renal/GU negative Renal ROS  negative genitourinary   Musculoskeletal   Abdominal   Peds  Hematology negative hematology ROS (+)   Anesthesia Other Findings   Reproductive/Obstetrics negative OB ROS                             Anesthesia Physical Anesthesia Plan  ASA: II  Anesthesia Plan: MAC   Post-op Pain Management:    Induction:   PONV Risk Score and Plan: Propofol infusion  Airway Management Planned:   Additional Equipment:   Intra-op Plan:   Post-operative Plan:   Informed Consent: I have reviewed the patients History and Physical, chart, labs and discussed the procedure including the risks, benefits and alternatives for the proposed anesthesia with the patient or authorized representative who has indicated his/her understanding and acceptance.     Dental Advisory Given  Plan Discussed with: CRNA  Anesthesia Plan Comments:         Anesthesia Quick Evaluation

## 2020-04-27 NOTE — Transfer of Care (Signed)
Immediate Anesthesia Transfer of Care Note  Patient: Abigail Evans  Procedure(s) Performed: CATARACT EXTRACTION PHACO AND INTRAOCULAR LENS PLACEMENT LEFT EYE (Left Eye)  Patient Location: Short Stay  Anesthesia Type:MAC  Level of Consciousness: awake, alert  and oriented  Airway & Oxygen Therapy: Patient Spontanous Breathing  Post-op Assessment: Report given to RN and Post -op Vital signs reviewed and stable  Post vital signs: Reviewed and stable  Last Vitals:  Vitals Value Taken Time  BP    Temp    Pulse    Resp    SpO2      Last Pain:  Vitals:   04/27/20 1020  TempSrc: Oral  PainSc: 0-No pain      Patients Stated Pain Goal: 9 (63/14/97 0263)  Complications: No complications documented.

## 2020-04-27 NOTE — Interval H&P Note (Signed)
History and Physical Interval Note:  04/27/2020 11:04 AM  Abigail Evans  has presented today for surgery, with the diagnosis of Nuclear sclerotic cataract - Left eye.  The various methods of treatment have been discussed with the patient and family. After consideration of risks, benefits and other options for treatment, the patient has consented to  Procedure(s) with comments: CATARACT EXTRACTION PHACO AND INTRAOCULAR LENS PLACEMENT (IOC) (Left) - left as a surgical intervention.  The patient's history has been reviewed, patient examined, no change in status, stable for surgery.  I have reviewed the patient's chart and labs.  Questions were answered to the patient's satisfaction.     Fabio Pierce

## 2020-04-30 ENCOUNTER — Encounter (HOSPITAL_COMMUNITY): Payer: Self-pay | Admitting: Ophthalmology

## 2020-05-03 NOTE — H&P (Addendum)
Surgical History & Physical  Patient Name: Abigail Evans DOB: 28-Jul-1945  Surgery: Cataract extraction with intraocular lens implant phacoemulsification; Right Eye  Surgeon: Fabio Pierce MD Surgery Date:  06/15/2020 Pre-Op Date:  05/24/2020  HPI: A 59 Yr. old female patient The patient is returning after cataract surgery. The left eye is affected. Status post cataract surgery, which began 2 weeks ago: Since the last visit, the affected area is doing well. The patient's vision is improved and stable. Patient is following medication instructions. Omni BID OS. Pt denies any increase in floaters or flashes of light. The patient complains of difficulty when seeing street signs, which began 6 months ago. The right eye is affected. The episode is gradual. The condition's severity increased since last visit. Symptoms occur when the patient is driving, inside and outside. This is negatively affecting the patient's quality of life. HPI was performed by Fabio Pierce .  Medical History: Cataracts Vitreous floaters Lung Problems Spinal Stenosis  Review of Systems Negative Allergic/Immunologic Negative Cardiovascular Negative Constitutional Negative Ear, Nose, Mouth & Throat Negative Endocrine Negative Eyes Negative Gastrointestinal Negative Genitourinary Negative Hemotologic/Lymphatic Negative Integumentary Negative Musculoskeletal Negative Neurological Negative Psychiatry Negative Respiratory  Social   Former smoker   Medication Prednisolone-gatiflox-bromfenac,  Spiriva, Calcium, Fish Oil,   Sx/Procedures Phaco c IOL OS,   Drug Allergies   NKDA  History & Physical: Heent:  Cataract, Right eye NECK: supple without bruits LUNGS: lungs clear to auscultation CV: regular rate and rhythm Abdomen: soft and non-tender  Impression & Plan: Assessment: 1.  CATARACT EXTRACTION STATUS; Left Eye (Z98.42) 2.  NUCLEAR SCLEROSIS AGE RELATED; Right Eye (H25.11)  Plan: 1.  1 week after  cataract surgery. Doing well with improved vision and normal eye pressure. Call with any problems or concerns. Continue Gati-Brom-Pred 2x/day for 3 more weeks. 2.  Cataract accounts for the patient's decreased vision. This visual impairment is not correctable with a tolerable change in glasses or contact lenses. Cataract surgery with an implantation of a new lens should significantly improve the visual and functional status of the patient. Discussed all risks, benefits, alternatives, and potential complications. Discussed the procedures and recovery. Patient desires to have surgery. A-scan ordered and performed today for intra-ocular lens calculations. The surgery will be performed in order to improve vision for driving, reading, and for eye examinations. Recommend phacoemulsification with intra-ocular lens. Recommend Dextenza for post-operative pain and inflammation. Right Eye. Surgery required to correct imbalance of vision. Dilates well - shugarcaine by protocol.

## 2020-05-03 NOTE — Patient Instructions (Signed)
Your procedure is scheduled on:  05/11/2020               Report to [facility] at 8:30    AM.                Call this number if you have problems the morning of surgery: [phone]   Do not eat or drink :After Midnight.    Take these medicines the morning of surgery with A SIP OF WATER:   Metoprolol, Buspar, and Symbicort inhaler,  Albuterol inhaler if needed         Do not wear jewelry, make-up or nail polish.  Do not wear lotions, powders, or perfumes. You may wear deodorant.  Do not bring valuables to the hospital.  Contacts, dentures or bridgework may not be worn into surgery.  Patients discharged the day of surgery will not be allowed to drive home.  Name and phone number of your driver.                                                                                                                                       Cataract Surgery  A cataract is a clouding of the lens of the eye. When a lens becomes cloudy, vision is reduced based on the degree and nature of the clouding. Surgery may be needed to improve vision. Surgery removes the cloudy lens and usually replaces it with a substitute lens (intraocular lens, IOL). LET YOUR EYE DOCTOR KNOW ABOUT:  Allergies to food or medicine.   Medicines taken including herbs, eyedrops, over-the-counter medicines, and creams.   Use of steroids (by mouth or creams).   Previous problems with anesthetics or numbing medicine.   History of bleeding problems or blood clots.   Previous surgery.   Other health problems, including diabetes and kidney problems.   Possibility of pregnancy, if this applies.  RISKS AND COMPLICATIONS  Infection.   Inflammation of the eyeball (endophthalmitis) that can spread to both eyes (sympathetic ophthalmia).   Poor wound healing.   If an IOL is inserted, it can later fall out of proper position. This is very uncommon.   Clouding of the part of your eye that holds an IOL in place. This is called an  "after-cataract." These are uncommon, but easily treated.  BEFORE THE PROCEDURE  Do not eat or drink anything except small amounts of water for 8 to 12 before your surgery, or as directed by your caregiver.    Unless you are told otherwise, continue any eyedrops you have been prescribed.   Talk to your primary caregiver about all other medicines that you take (both prescription and non-prescription). In some cases, you may need to stop or change medicines near the time of your surgery. This is most important if you are taking blood-thinning medicine. Do not stop medicines unless you are told to do so.   Arrange for someone  to drive you to and from the procedure.   Do not put contact lenses in either eye on the day of your surgery.  PROCEDURE There is more than one method for safely removing a cataract. Your doctor can explain the differences and help determine which is best for you. Phacoemulsification surgery is the most common form of cataract surgery.  An injection is given behind the eye or eyedrops are given to make this a painless procedure.   A small cut (incision) is made on the edge of the clear, dome-shaped surface that covers the front of the eye (cornea).   A tiny probe is painlessly inserted into the eye. This device gives off ultrasound waves that soften and break up the cloudy center of the lens. This makes it easier for the cloudy lens to be removed by suction.   An IOL may be implanted.   The normal lens of the eye is covered by a clear capsule. Part of that capsule is intentionally left in the eye to support the IOL.   Your surgeon may or may not use stitches to close the incision.  There are other forms of cataract surgery that require a larger incision and stiches to close the eye. This approach is taken in cases where the doctor feels that the cataract cannot be easily removed using phacoemulsification. AFTER THE PROCEDURE  When an IOL is implanted, it does not need  care. It becomes a permanent part of your eye and cannot be seen or felt.   Your doctor will schedule follow-up exams to check on your progress.   Review your other medicines with your doctor to see which can be resumed after surgery.   Use eyedrops or take medicine as prescribed by your doctor.  Document Released: 03/27/2011 Document Reviewed: 03/24/2011 [facility] Patient Information 2012 [place].  .Cataract Surgery Care After Refer to this sheet in the next few weeks. These instructions provide you with information on caring for yourself after your procedure. Your caregiver may also give you more specific instructions. Your treatment has been planned according to current medical practices, but problems sometimes occur. Call your caregiver if you have any problems or questions after your procedure.  HOME CARE INSTRUCTIONS   Avoid strenuous activities as directed by your caregiver.   Ask your caregiver when you can resume driving.   Use eyedrops or other medicines to help healing and control pressure inside your eye as directed by your caregiver.   Only take over-the-counter or prescription medicines for pain, discomfort, or fever as directed by your caregiver.   Do not to touch or rub your eyes.   You may be instructed to use a protective shield during the first few days and nights after surgery. If not, wear sunglasses to protect your eyes. This is to protect the eye from pressure or from being accidentally bumped.   Keep the area around your eye clean and dry. Avoid swimming or allowing water to hit you directly in the face while showering. Keep soap and shampoo out of your eyes.   Do not bend or lift heavy objects. Bending increases pressure in the eye. You can walk, climb stairs, and do light household chores.   Do not put a contact lens into the eye that had surgery until your caregiver says it is okay to do so.   Ask your doctor when you can return to work. This will  depend on the kind of work that you do. If you work  in a dusty environment, you may be advised to wear protective eyewear for a period of time.   Ask your caregiver when it will be safe to engage in sexual activity.   Continue with your regular eye exams as directed by your caregiver.  What to expect:  It is normal to feel itching and mild discomfort for a few days after cataract surgery. Some fluid discharge is also common, and your eye may be sensitive to light and touch.   After 1 to 2 days, even moderate discomfort should disappear. In most cases, healing will take about 6 weeks.   If you received an intraocular lens (IOL), you may notice that colors are very bright or have a blue tinge. Also, if you have been in bright sunlight, everything may appear reddish for a few hours. If you see these color tinges, it is because your lens is clear and no longer cloudy. Within a few months after receiving an IOL, these extra colors should go away. When you have healed, you will probably need new glasses.  SEEK MEDICAL CARE IF:   You have increased bruising around your eye.   You have discomfort not helped by medicine.  SEEK IMMEDIATE MEDICAL CARE IF:   You have a  fever.   You have a worsening or sudden vision loss.   You have redness, swelling, or increasing pain in the eye.   You have a thick discharge from the eye that had surgery.  MAKE SURE YOU:  Understand these instructions.   Will watch your condition.   Will get help right away if you are not doing well or get worse.  Document Released: 10/25/2004 Document Revised: 03/27/2011 Document Reviewed: 11/29/2010 [facility] Patient Information 2012 [place].    Monitored Anesthesia Care  Monitored anesthesia care is an anesthesia service for a medical procedure. Anesthesia is the loss of the ability to feel pain. It is produced by medications called anesthetics. It may affect a small area of your body (local anesthesia), a large  area of your body (regional anesthesia), or your entire body (general anesthesia). The need for monitored anesthesia care depends your procedure, your condition, and the potential need for regional or general anesthesia. It is often provided during procedures where:   General anesthesia may be needed if there are complications. This is because you need special care when you are under general anesthesia.    You will be under local or regional anesthesia. This is so that you are able to have higher levels of anesthesia if needed.    You will receive calming medications (sedatives). This is especially the case if sedatives are given to put you in a semi-conscious state of relaxation (deep sedation). This is because the amount of sedative needed to produce this state can be hard to predict. Too much of a sedative can produce general anesthesia. Monitored anesthesia care is performed by one or more caregivers who have special training in all types of anesthesia. You will need to meet with these caregivers before your procedure. During this meeting, they will ask you about your medical history. They will also give you instructions to follow. (For example, you will need to stop eating and drinking before your procedure. You may also need to stop or change medications you are taking.) During your procedure, your caregivers will stay with you. They will:   Watch your condition. This includes watching you blood pressure, breathing, and level of pain.    Diagnose and treat problems  that occur.    Give medications if they are needed. These may include calming medications (sedatives) and anesthetics.    Make sure you are comfortable.   Having monitored anesthesia care does not necessarily mean that you will be under anesthesia. It does mean that your caregivers will be able to manage anesthesia if you need it or if it occurs. It also means that you will be able to have a different type of anesthesia than you are  having if you need it. When your procedure is complete, your caregivers will continue to watch your condition. They will make sure any medications wear off before you are allowed to go home.  Document Released: 01/01/2005 Document Revised: 08/02/2012 Document Reviewed: 05/19/2012 [facility] Patient Information 2014 [place], [place].

## 2020-05-07 ENCOUNTER — Other Ambulatory Visit: Payer: Self-pay

## 2020-05-07 ENCOUNTER — Encounter (HOSPITAL_COMMUNITY)
Admission: RE | Admit: 2020-05-07 | Discharge: 2020-05-07 | Disposition: A | Discharge status: Home / Self Care | Payer: PPO | Source: Ambulatory Visit | Attending: Ophthalmology | Admitting: Ophthalmology

## 2020-05-09 ENCOUNTER — Other Ambulatory Visit (HOSPITAL_COMMUNITY): Payer: PPO

## 2020-05-17 DIAGNOSIS — J449 Chronic obstructive pulmonary disease, unspecified: Secondary | ICD-10-CM | POA: Diagnosis not present

## 2020-05-19 DIAGNOSIS — G894 Chronic pain syndrome: Secondary | ICD-10-CM | POA: Diagnosis not present

## 2020-05-19 DIAGNOSIS — M810 Age-related osteoporosis without current pathological fracture: Secondary | ICD-10-CM | POA: Diagnosis not present

## 2020-05-19 DIAGNOSIS — Z87891 Personal history of nicotine dependence: Secondary | ICD-10-CM | POA: Diagnosis not present

## 2020-05-19 DIAGNOSIS — J449 Chronic obstructive pulmonary disease, unspecified: Secondary | ICD-10-CM | POA: Diagnosis not present

## 2020-06-12 ENCOUNTER — Encounter (HOSPITAL_COMMUNITY): Payer: Self-pay

## 2020-06-12 ENCOUNTER — Other Ambulatory Visit: Payer: Self-pay

## 2020-06-12 ENCOUNTER — Encounter (HOSPITAL_COMMUNITY)
Admission: RE | Admit: 2020-06-12 | Discharge: 2020-06-12 | Disposition: A | Discharge status: Home / Self Care | Payer: PPO | Source: Ambulatory Visit | Attending: Ophthalmology | Admitting: Ophthalmology

## 2020-06-13 ENCOUNTER — Other Ambulatory Visit (HOSPITAL_COMMUNITY)
Admission: RE | Admit: 2020-06-13 | Discharge: 2020-06-13 | Disposition: A | Discharge status: Home / Self Care | Payer: PPO | Source: Ambulatory Visit | Attending: Ophthalmology | Admitting: Ophthalmology

## 2020-06-13 DIAGNOSIS — U071 COVID-19: Secondary | ICD-10-CM | POA: Insufficient documentation

## 2020-06-13 DIAGNOSIS — Z01812 Encounter for preprocedural laboratory examination: Secondary | ICD-10-CM | POA: Diagnosis not present

## 2020-06-13 LAB — SARS CORONAVIRUS 2 (TAT 6-24 HRS): SARS Coronavirus 2: POSITIVE — AB

## 2020-06-14 ENCOUNTER — Telehealth: Payer: Self-pay

## 2020-06-14 DIAGNOSIS — J449 Chronic obstructive pulmonary disease, unspecified: Secondary | ICD-10-CM | POA: Diagnosis not present

## 2020-06-14 DIAGNOSIS — U071 COVID-19: Secondary | ICD-10-CM | POA: Diagnosis not present

## 2020-06-14 DIAGNOSIS — K219 Gastro-esophageal reflux disease without esophagitis: Secondary | ICD-10-CM | POA: Diagnosis not present

## 2020-06-14 NOTE — Telephone Encounter (Signed)
Called to discuss with patient about COVID-19 symptoms and the use of one of the available treatments for those with mild to moderate Covid symptoms and at a high risk of hospitalization.  Pt appears to qualify for outpatient treatment due to co-morbid conditions and/or a member of an at-risk group in accordance with the FDA Emergency Use Authorization.    Symptom onset: None Vaccinated: No Booster? No Immunocompromised? No Qualifiers: COPD  Reports no symptoms.   Abigail Evans

## 2020-06-17 DIAGNOSIS — J449 Chronic obstructive pulmonary disease, unspecified: Secondary | ICD-10-CM | POA: Diagnosis not present

## 2020-06-25 DIAGNOSIS — H25811 Combined forms of age-related cataract, right eye: Secondary | ICD-10-CM | POA: Diagnosis not present

## 2020-06-26 NOTE — H&P (Signed)
Surgical History & Physical  Patient Name: Abigail Evans DOB: [birth year]  Surgery: Cataract extraction with intraocular lens implant phacoemulsification; Right Eye  Surgeon: Fabio Pierce MD Surgery Date:  07/02/2020 Pre-Op Date:  06/13/2020  HPI: A 11 Yr. old female patient The patient is returning after cataract surgery. The left eye is affected. Status post cataract surgery, which began 2 weeks ago: Since the last visit, the affected area is doing well. The patient's vision is improved and stable. Patient is following medication instructions. Omni BID OS. Pt denies any increase in floaters or flashes of light. The patient complains of difficulty when seeing street signs, which began 6 months ago. The right eye is affected. The episode is gradual. The condition's severity increased since last visit. Symptoms occur when the patient is driving, inside and outside. This is negatively affecting the patient's quality of life. HPI was performed by Fabio Pierce .  Medical History: Cataracts Vitreous floaters Lung Problems Spinal Stenosis COVID + (06/13/2020)  Review of Systems Negative Allergic/Immunologic Negative Cardiovascular Negative Constitutional Negative Ear, Nose, Mouth & Throat Negative Endocrine Negative Eyes Negative Gastrointestinal Negative Genitourinary Negative Hemotologic/Lymphatic Negative Integumentary Negative Musculoskeletal Negative Neurological Negative Psychiatry Negative Respiratory  Social   Former smoker   Medication Prednisolone-gatiflox-bromfenac,  Spiriva, Calcium, Fish Oil,   Sx/Procedures Phaco c IOL OS,   Drug Allergies   NKDA  History & Physical: Heent:  Cataract, Right eye NECK: supple without bruits LUNGS: lungs clear to auscultation CV: regular rate and rhythm Abdomen: soft and non-tender  Impression & Plan: Assessment: 1.  CATARACT EXTRACTION STATUS; Left Eye (Z98.42) 2.  NUCLEAR SCLEROSIS AGE RELATED; Right Eye  (H25.11)  Plan: 1.  1 week after cataract surgery. Doing well with improved vision and normal eye pressure. Call with any problems or concerns. Continue Gati-Brom-Pred 2x/day for 3 more weeks. 2.  Cataract accounts for the patient's decreased vision. This visual impairment is not correctable with a tolerable change in glasses or contact lenses. Cataract surgery with an implantation of a new lens should significantly improve the visual and functional status of the patient. Discussed all risks, benefits, alternatives, and potential complications. Discussed the procedures and recovery. Patient desires to have surgery. A-scan ordered and performed today for intra-ocular lens calculations. The surgery will be performed in order to improve vision for driving, reading, and for eye examinations. Recommend phacoemulsification with intra-ocular lens. Recommend Dextenza for post-operative pain and inflammation. Right Eye. Surgery required to correct imbalance of vision. Dilates well - shugarcaine by protocol.

## 2020-06-28 ENCOUNTER — Encounter (HOSPITAL_COMMUNITY)
Admission: RE | Admit: 2020-06-28 | Discharge: 2020-06-28 | Disposition: A | Discharge status: Home / Self Care | Payer: PPO | Source: Ambulatory Visit | Attending: Ophthalmology | Admitting: Ophthalmology

## 2020-06-28 ENCOUNTER — Encounter (HOSPITAL_COMMUNITY): Payer: Self-pay

## 2020-06-28 ENCOUNTER — Other Ambulatory Visit: Payer: Self-pay

## 2020-06-29 ENCOUNTER — Ambulatory Visit: Payer: PPO | Admitting: Cardiovascular Disease

## 2020-07-02 ENCOUNTER — Ambulatory Visit (HOSPITAL_COMMUNITY): Payer: PPO | Admitting: Anesthesiology

## 2020-07-02 ENCOUNTER — Encounter (HOSPITAL_COMMUNITY): Payer: Self-pay | Admitting: Ophthalmology

## 2020-07-02 ENCOUNTER — Encounter (HOSPITAL_COMMUNITY)
Admission: RE | Disposition: A | Discharge status: Home / Self Care | Payer: Self-pay | Source: Home / Self Care | Attending: Ophthalmology

## 2020-07-02 ENCOUNTER — Ambulatory Visit (HOSPITAL_COMMUNITY)
Admission: RE | Admit: 2020-07-02 | Discharge: 2020-07-02 | Disposition: A | Discharge status: Home / Self Care | Payer: PPO | Attending: Ophthalmology | Admitting: Ophthalmology

## 2020-07-02 DIAGNOSIS — H25811 Combined forms of age-related cataract, right eye: Secondary | ICD-10-CM | POA: Insufficient documentation

## 2020-07-02 DIAGNOSIS — Z9842 Cataract extraction status, left eye: Secondary | ICD-10-CM | POA: Diagnosis not present

## 2020-07-02 DIAGNOSIS — J441 Chronic obstructive pulmonary disease with (acute) exacerbation: Secondary | ICD-10-CM | POA: Diagnosis not present

## 2020-07-02 DIAGNOSIS — Z87891 Personal history of nicotine dependence: Secondary | ICD-10-CM | POA: Insufficient documentation

## 2020-07-02 HISTORY — PX: CATARACT EXTRACTION W/PHACO: SHX586

## 2020-07-02 SURGERY — PHACOEMULSIFICATION, CATARACT, WITH IOL INSERTION
Anesthesia: Monitor Anesthesia Care | Site: Eye | Laterality: Right

## 2020-07-02 MED ORDER — EPINEPHRINE PF 1 MG/ML IJ SOLN
INTRAOCULAR | Status: DC | PRN
  Administered 2020-07-02: 500 mL

## 2020-07-02 MED ORDER — LIDOCAINE HCL (PF) 1 % IJ SOLN
INTRAOCULAR | Status: DC | PRN
  Administered 2020-07-02: 1 mL via OPHTHALMIC

## 2020-07-02 MED ORDER — POVIDONE-IODINE 5 % OP SOLN
OPHTHALMIC | Status: DC | PRN
  Administered 2020-07-02: 1 via OPHTHALMIC

## 2020-07-02 MED ORDER — SODIUM HYALURONATE 23 MG/ML IO SOLN
INTRAOCULAR | Status: DC | PRN
  Administered 2020-07-02: 0.6 mL via INTRAOCULAR

## 2020-07-02 MED ORDER — BSS IO SOLN
INTRAOCULAR | Status: DC | PRN
  Administered 2020-07-02: 15 mL via INTRAOCULAR

## 2020-07-02 MED ORDER — PHENYLEPHRINE HCL 2.5 % OP SOLN
1.0000 [drp] | OPHTHALMIC | Status: AC | PRN
  Administered 2020-07-02 (×3): 1 [drp] via OPHTHALMIC

## 2020-07-02 MED ORDER — LIDOCAINE HCL 3.5 % OP GEL
1.0000 "application " | Freq: Once | OPHTHALMIC | Status: AC
  Administered 2020-07-02: 1 via OPHTHALMIC

## 2020-07-02 MED ORDER — TETRACAINE HCL 0.5 % OP SOLN
1.0000 [drp] | OPHTHALMIC | Status: AC | PRN
  Administered 2020-07-02 (×3): 1 [drp] via OPHTHALMIC

## 2020-07-02 MED ORDER — STERILE WATER FOR IRRIGATION IR SOLN
Status: DC | PRN
  Administered 2020-07-02: 250 mL

## 2020-07-02 MED ORDER — EPINEPHRINE PF 1 MG/ML IJ SOLN
INTRAMUSCULAR | Status: AC
  Filled 2020-07-02: qty 2

## 2020-07-02 MED ORDER — NEOMYCIN-POLYMYXIN-DEXAMETH 3.5-10000-0.1 OP SUSP
OPHTHALMIC | Status: DC | PRN
  Administered 2020-07-02: 1 [drp] via OPHTHALMIC

## 2020-07-02 MED ORDER — TROPICAMIDE 1 % OP SOLN
1.0000 [drp] | OPHTHALMIC | Status: AC
Start: 2020-07-02 — End: 2020-07-02
  Administered 2020-07-02 (×3): 1 [drp] via OPHTHALMIC

## 2020-07-02 MED ORDER — PROVISC 10 MG/ML IO SOLN
INTRAOCULAR | Status: DC | PRN
  Administered 2020-07-02: 0.85 mL via INTRAOCULAR

## 2020-07-02 SURGICAL SUPPLY — 13 items
CLOTH BEACON ORANGE TIMEOUT ST (SAFETY) ×1 IMPLANT
EYE SHIELD UNIVERSAL CLEAR (GAUZE/BANDAGES/DRESSINGS) ×1 IMPLANT
GLOVE SURG UNDER POLY LF SZ6.5 (GLOVE) ×1 IMPLANT
GLOVE SURG UNDER POLY LF SZ7 (GLOVE) ×1 IMPLANT
NDL HYPO 18GX1.5 BLUNT FILL (NEEDLE) IMPLANT
NEEDLE HYPO 18GX1.5 BLUNT FILL (NEEDLE) ×2 IMPLANT
PAD ARMBOARD 7.5X6 YLW CONV (MISCELLANEOUS) ×1 IMPLANT
SYR TB 1ML LL NO SAFETY (SYRINGE) ×1 IMPLANT
TAPE SURG TRANSPORE 1 IN (GAUZE/BANDAGES/DRESSINGS) IMPLANT
TAPE SURGICAL TRANSPORE 1 IN (GAUZE/BANDAGES/DRESSINGS) ×2
TECNIS IOL (Intraocular Lens) ×1 IMPLANT
VISCOELASTIC ADDITIONAL (OPHTHALMIC RELATED) IMPLANT
WATER STERILE IRR 250ML POUR (IV SOLUTION) ×1 IMPLANT

## 2020-07-02 NOTE — Interval H&P Note (Signed)
History and Physical Interval Note:  07/02/2020 8:28 AM  Abigail Evans  has presented today for surgery, with the diagnosis of Nuclear sclerotic cataract - Right eye.  The various methods of treatment have been discussed with the patient and family. After consideration of risks, benefits and other options for treatment, the patient has consented to  Procedure(s) with comments: CATARACT EXTRACTION PHACO AND INTRAOCULAR LENS PLACEMENT (IOC) (Right) - right, pt tested + 2/23 as a surgical intervention.  The patient's history has been reviewed, patient examined, no change in status, stable for surgery.  I have reviewed the patient's chart and labs.  Questions were answered to the patient's satisfaction.     Fabio Pierce

## 2020-07-02 NOTE — Anesthesia Preprocedure Evaluation (Signed)
Anesthesia Evaluation  Patient identified by MRN, date of birth, ID band Patient awake    Reviewed: Allergy & Precautions, NPO status , Patient's Chart, lab work & pertinent test results, reviewed documented beta blocker date and time   History of Anesthesia Complications Negative for: history of anesthetic complications  Airway Mallampati: II  TM Distance: >3 FB Neck ROM: Full    Dental  (+) Dental Advisory Given, Caps   Pulmonary pneumonia, COPD,  COPD inhaler, former smoker,    Pulmonary exam normal breath sounds clear to auscultation       Cardiovascular Exercise Tolerance: Good (-) hypertensionPt. on home beta blockers Normal cardiovascular exam+ dysrhythmias (tachycardia - on metoprolol)  Rhythm:Regular Rate:Normal     Neuro/Psych negative neurological ROS  negative psych ROS   GI/Hepatic Neg liver ROS, GERD  ,  Endo/Other  negative endocrine ROS  Renal/GU negative Renal ROS     Musculoskeletal  (+) Arthritis ,   Abdominal   Peds  Hematology negative hematology ROS (+)   Anesthesia Other Findings   Reproductive/Obstetrics negative OB ROS                             Anesthesia Physical Anesthesia Plan  ASA: II  Anesthesia Plan: MAC   Post-op Pain Management:    Induction:   PONV Risk Score and Plan:   Airway Management Planned: Nasal Cannula and Natural Airway  Additional Equipment:   Intra-op Plan:   Post-operative Plan:   Informed Consent: I have reviewed the patients History and Physical, chart, labs and discussed the procedure including the risks, benefits and alternatives for the proposed anesthesia with the patient or authorized representative who has indicated his/her understanding and acceptance.     Dental advisory given  Plan Discussed with: CRNA and Surgeon  Anesthesia Plan Comments:         Anesthesia Quick Evaluation

## 2020-07-02 NOTE — Discharge Instructions (Addendum)
Please discharge patient when stable, will follow up today with Dr. Wrzosek at the [facility] [place] office immediately following discharge.  Leave shield in place until visit.  All paperwork with discharge instructions will be given at the office.  [facility] Address:  [address]  [place], [place] [address]  

## 2020-07-02 NOTE — Op Note (Signed)
Date of procedure: 07/02/20  Pre-operative diagnosis:  Visually significant combined form age-related cataract, Right Eye (H25.811)  Post-operative diagnosis:  Visually significant combined form age-related cataract, Right Eye (H25.811)  Procedure: Removal of cataract via phacoemulsification and insertion of intra-ocular lens [employer] [id number]  +21.0D into the capsular bag of the Right Eye  Attending surgeon: Gerda Diss. Yuto Cajuste, MD, MA  Anesthesia: MAC, Topical Akten  Complications: None  Estimated Blood Loss: <65m (minimal)  Specimens: None  Implants: As above  Indications:  Visually significant age-related cataract, Right Eye  Procedure:  The patient was seen and identified in the pre-operative area. The operative eye was identified and dilated.  The operative eye was marked.  Topical anesthesia was administered to the operative eye.     The patient was then to the operative suite and placed in the supine position.  A timeout was performed confirming the patient, procedure to be performed, and all other relevant information.   The patient's face was prepped and draped in the usual fashion for intra-ocular surgery.  A lid speculum was placed into the operative eye and the surgical microscope moved into place and focused.  A superotemporal paracentesis was created using a 20 gauge paracentesis blade.  Shugarcaine was injected into the anterior chamber.  Viscoelastic was injected into the anterior chamber.  A temporal clear-corneal main wound incision was created using a 2.441mmicrokeratome.  A continuous curvilinear capsulorrhexis was initiated using an irrigating cystitome and completed using capsulorrhexis forceps.  Hydrodissection and hydrodeliniation were performed.  Viscoelastic was injected into the anterior chamber.  A phacoemulsification handpiece and a chopper as a second instrument were used to remove the nucleus and epinucleus. The irrigation/aspiration handpiece was  used to remove any remaining cortical material.   The capsular bag was reinflated with viscoelastic, checked, and found to be intact.  The intraocular lens was inserted into the capsular bag.  The irrigation/aspiration handpiece was used to remove any remaining viscoelastic.  The clear corneal wound and paracentesis wounds were then hydrated and checked with Weck-Cels to be watertight.  The lid-speculum was removed.  The drape was removed.  The patient's face was cleaned with a wet and dry 4x4.   Maxitrol was instilled in the eye. A clear shield was taped over the eye. The patient was taken to the post-operative care unit in good condition, having tolerated the procedure well.  Post-Op Instructions: [facility] and will receive all other orders and instructions.

## 2020-07-02 NOTE — Transfer of Care (Signed)
Immediate Anesthesia Transfer of Care Note  Patient: Abigail Evans  Procedure(s) Performed: CATARACT EXTRACTION PHACO AND INTRAOCULAR LENS PLACEMENT (IOC) (Right Eye)  Patient Location: Short Stay  Anesthesia Type:MAC  Level of Consciousness: awake  Airway & Oxygen Therapy: Patient Spontanous Breathing  Post-op Assessment: Report given to RN  Post vital signs: Reviewed and stable  Last Vitals:  Vitals Value Taken Time  BP    Temp    Pulse    Resp    SpO2      Last Pain:  Vitals:   07/02/20 0729  TempSrc: Oral  PainSc: 0-No pain      Patients Stated Pain Goal: 5 (38/17/71 1657)  Complications: No complications documented.

## 2020-07-02 NOTE — Anesthesia Postprocedure Evaluation (Signed)
Anesthesia Post Note  Patient: Abigail Evans  Procedure(s) Performed: CATARACT EXTRACTION PHACO AND INTRAOCULAR LENS PLACEMENT (IOC) (Right Eye)  Patient location during evaluation: Short Stay Anesthesia Type: MAC Level of consciousness: awake and alert Pain management: pain level controlled Vital Signs Assessment: post-procedure vital signs reviewed and stable Respiratory status: spontaneous breathing Cardiovascular status: blood pressure returned to baseline and stable Postop Assessment: no apparent nausea or vomiting Anesthetic complications: no   No complications documented.   Last Vitals:  Vitals:   07/02/20 0729  BP: 127/70  Pulse: 71  Resp: 18  Temp: 36.7 C  SpO2: 97%    Last Pain:  Vitals:   07/02/20 0729  TempSrc: Oral  PainSc: 0-No pain                 Algernon Mundie

## 2020-07-04 ENCOUNTER — Encounter (HOSPITAL_COMMUNITY): Payer: Self-pay | Admitting: Ophthalmology

## 2020-07-15 DIAGNOSIS — J449 Chronic obstructive pulmonary disease, unspecified: Secondary | ICD-10-CM | POA: Diagnosis not present

## 2020-07-18 DIAGNOSIS — J449 Chronic obstructive pulmonary disease, unspecified: Secondary | ICD-10-CM | POA: Diagnosis not present

## 2020-07-18 DIAGNOSIS — F4542 Pain disorder with related psychological factors: Secondary | ICD-10-CM | POA: Diagnosis not present
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
## 2020-07-30 ENCOUNTER — Encounter: Payer: Self-pay | Admitting: Cardiovascular Disease

## 2020-07-30 ENCOUNTER — Ambulatory Visit: Payer: PPO | Admitting: Cardiovascular Disease

## 2020-07-30 ENCOUNTER — Other Ambulatory Visit: Payer: Self-pay

## 2020-07-30 VITALS — BP 118/68 | HR 74 | Ht 65.0 in | Wt 137.0 lb

## 2020-07-30 DIAGNOSIS — R000 Tachycardia, unspecified: Secondary | ICD-10-CM | POA: Diagnosis not present

## 2020-07-30 NOTE — Patient Instructions (Signed)
Medication Instructions:  No changes *If you need a refill on your cardiac medications before your next appointment, please call your pharmacy*   Lab Work: No labs today If you have labs (blood work) drawn today and your tests are completely normal, you will receive your results only by: Marland Kitchen MyChart Message (if you have MyChart) OR . A paper copy in the mail If you have any lab test that is abnormal or we need to change your treatment, we will call you to review the results.   Testing/Procedures: None  Follow-Up: At [facility], you and your health needs are our priority.  As part of our continuing mission to provide you with exceptional heart care, we have created designated Provider Care Teams.  These Care Teams include your primary Cardiologist (physician) and Advanced Practice Providers (APPs -  Physician Assistants and Nurse Practitioners) who all work together to provide you with the care you need, when you need it.  We recommend signing up for the patient portal called "MyChart".  Sign up information is provided on this After Visit Summary.  MyChart is used to connect with patients for Virtual Visits (Telemedicine).  Patients are able to view lab/test results, encounter notes, upcoming appointments, etc.  Non-urgent messages can be sent to your provider as well.   To learn more about what you can do with MyChart, go to [email or web address].    Your next appointment:   Follow up in 12 months  The format for your next appointment:     Provider:   Dr Eden Emms   Other Instructions None

## 2020-08-15 DIAGNOSIS — J449 Chronic obstructive pulmonary disease, unspecified: Secondary | ICD-10-CM | POA: Diagnosis not present

## 2020-09-10 DIAGNOSIS — M48061 Spinal stenosis, lumbar region without neurogenic claudication: Secondary | ICD-10-CM | POA: Diagnosis not present

## 2020-09-10 DIAGNOSIS — K219 Gastro-esophageal reflux disease without esophagitis: Secondary | ICD-10-CM | POA: Diagnosis not present

## 2020-09-10 DIAGNOSIS — Z6823 Body mass index (BMI) 23.0-23.9, adult: Secondary | ICD-10-CM | POA: Diagnosis not present

## 2020-09-10 DIAGNOSIS — J309 Allergic rhinitis, unspecified: Secondary | ICD-10-CM | POA: Diagnosis not present

## 2020-09-10 DIAGNOSIS — Z1331 Encounter for screening for depression: Secondary | ICD-10-CM | POA: Diagnosis not present

## 2020-09-10 DIAGNOSIS — J439 Emphysema, unspecified: Secondary | ICD-10-CM | POA: Diagnosis not present

## 2020-09-10 DIAGNOSIS — G894 Chronic pain syndrome: Secondary | ICD-10-CM | POA: Diagnosis not present

## 2020-09-10 DIAGNOSIS — Z1389 Encounter for screening for other disorder: Secondary | ICD-10-CM | POA: Diagnosis not present

## 2020-09-10 DIAGNOSIS — F419 Anxiety disorder, unspecified: Secondary | ICD-10-CM | POA: Diagnosis not present

## 2020-09-10 DIAGNOSIS — Z0001 Encounter for general adult medical examination with abnormal findings: Secondary | ICD-10-CM | POA: Diagnosis not present

## 2020-09-14 DIAGNOSIS — J449 Chronic obstructive pulmonary disease, unspecified: Secondary | ICD-10-CM | POA: Diagnosis not present

## 2020-09-18 DIAGNOSIS — F4542 Pain disorder with related psychological factors: Secondary | ICD-10-CM | POA: Diagnosis not present

## 2020-09-18 DIAGNOSIS — J449 Chronic obstructive pulmonary disease, unspecified: Secondary | ICD-10-CM | POA: Diagnosis not present

## 2020-10-15 ENCOUNTER — Other Ambulatory Visit: Payer: Self-pay | Admitting: Cardiovascular Disease

## 2020-10-15 DIAGNOSIS — J449 Chronic obstructive pulmonary disease, unspecified: Secondary | ICD-10-CM | POA: Diagnosis not present

## 2020-12-03 IMAGING — DX DG CHEST 2V
2 series · 2 of 2 positions shown · non-contrast
Comparison: 07/08/2018

CLINICAL DATA: Dyspnea

EXAM:
CHEST - 2 VIEW

[chest pa]
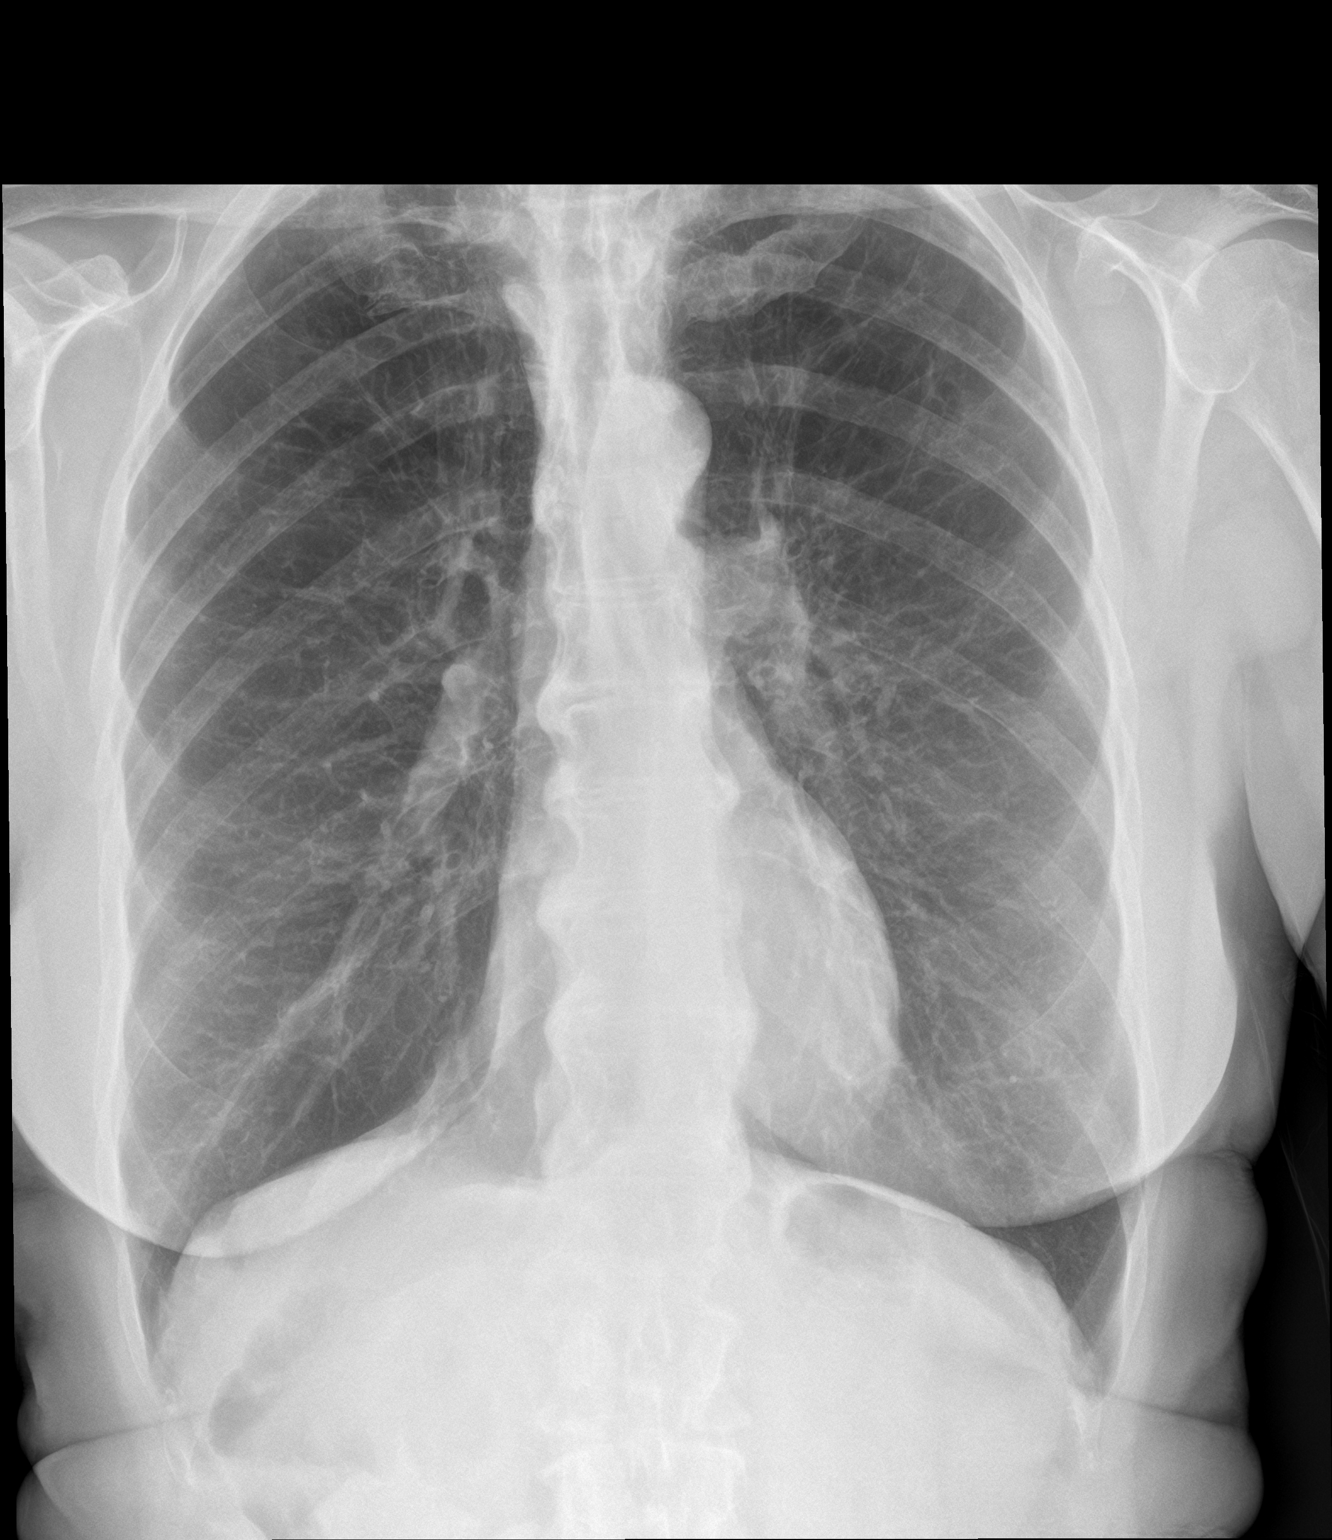

[chest lat]
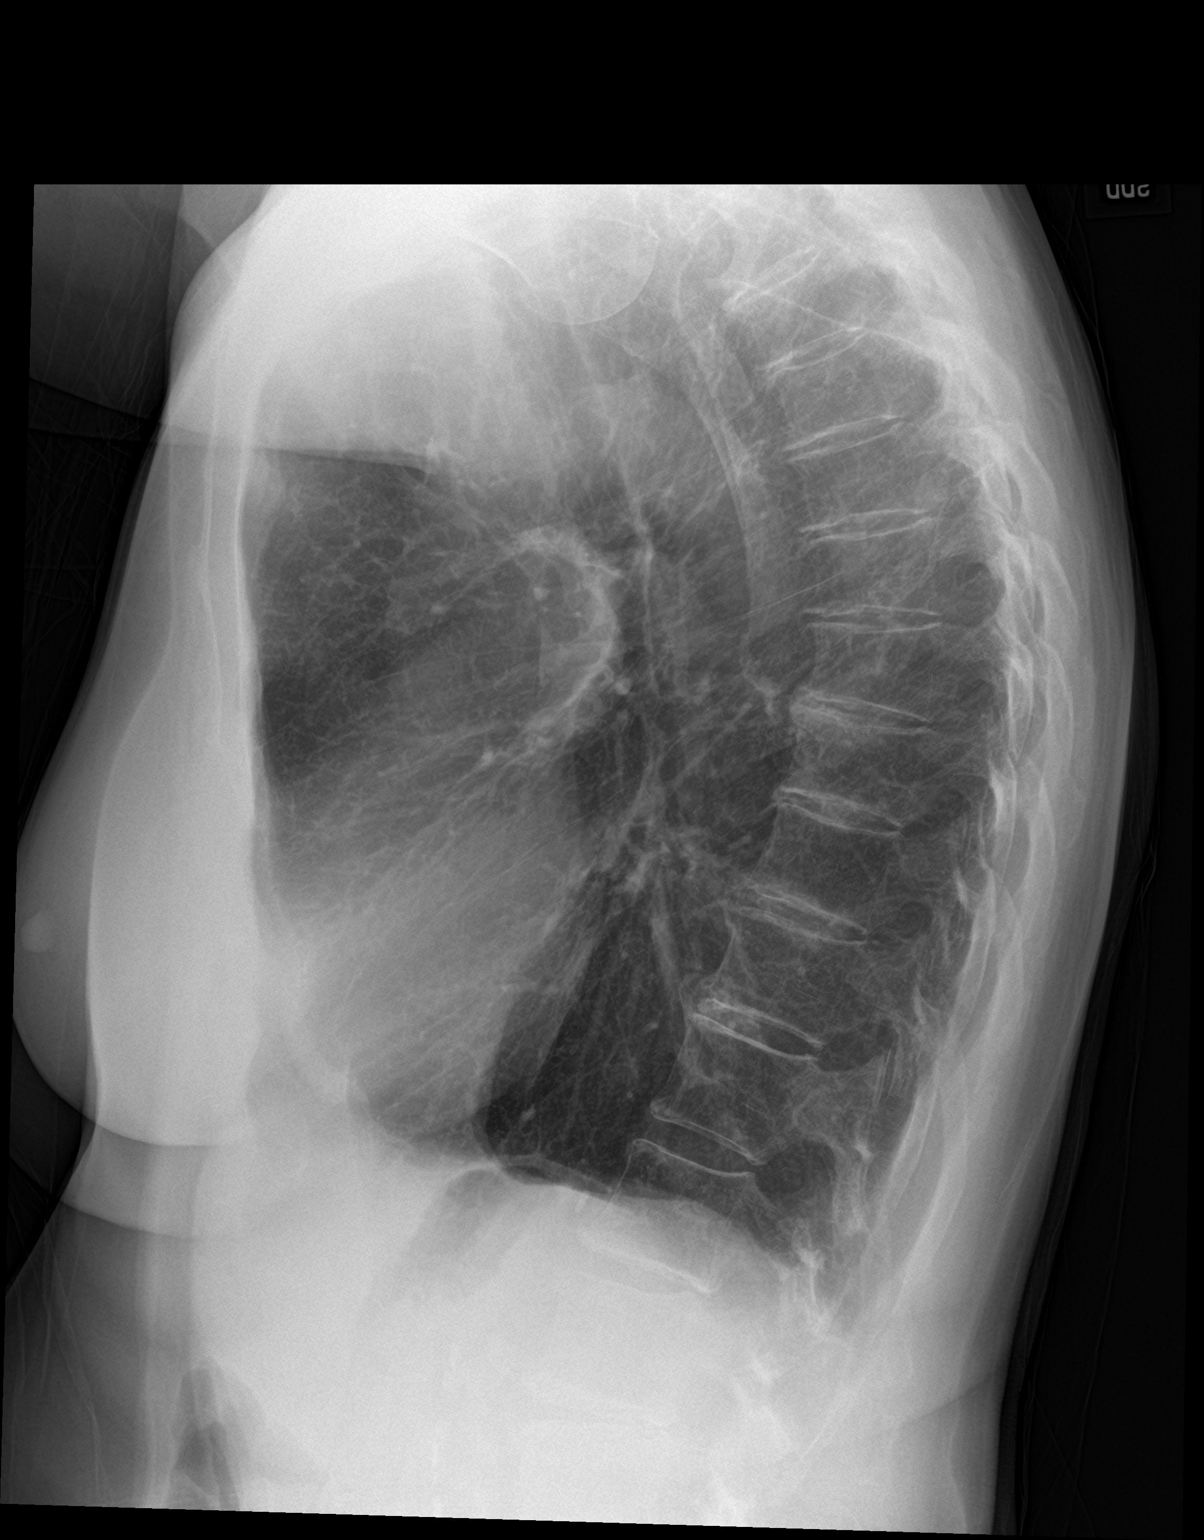

[2 of 2 positions shown; findings below may reference images not displayed]

FINDINGS: The heart size and mediastinal contours are within normal limits.
Hyperexpanded lungs with emphysematous changes of the lung apices
and chronic biapical pleuroparenchymal scarring. No focal airspace
consolidation, pleural effusion, or pneumothorax. The visualized
skeletal structures are unremarkable.
IMPRESSION: 1. No active cardiopulmonary disease.
2. Emphysema.

## 2021-02-07 IMAGING — DX DG CHEST 1V PORT
1 series · 1 of 1 positions shown · non-contrast
Comparison: September 08, 2019.

CLINICAL DATA: Shortness of breath.

EXAM:
PORTABLE CHEST 1 VIEW

[chest ap]
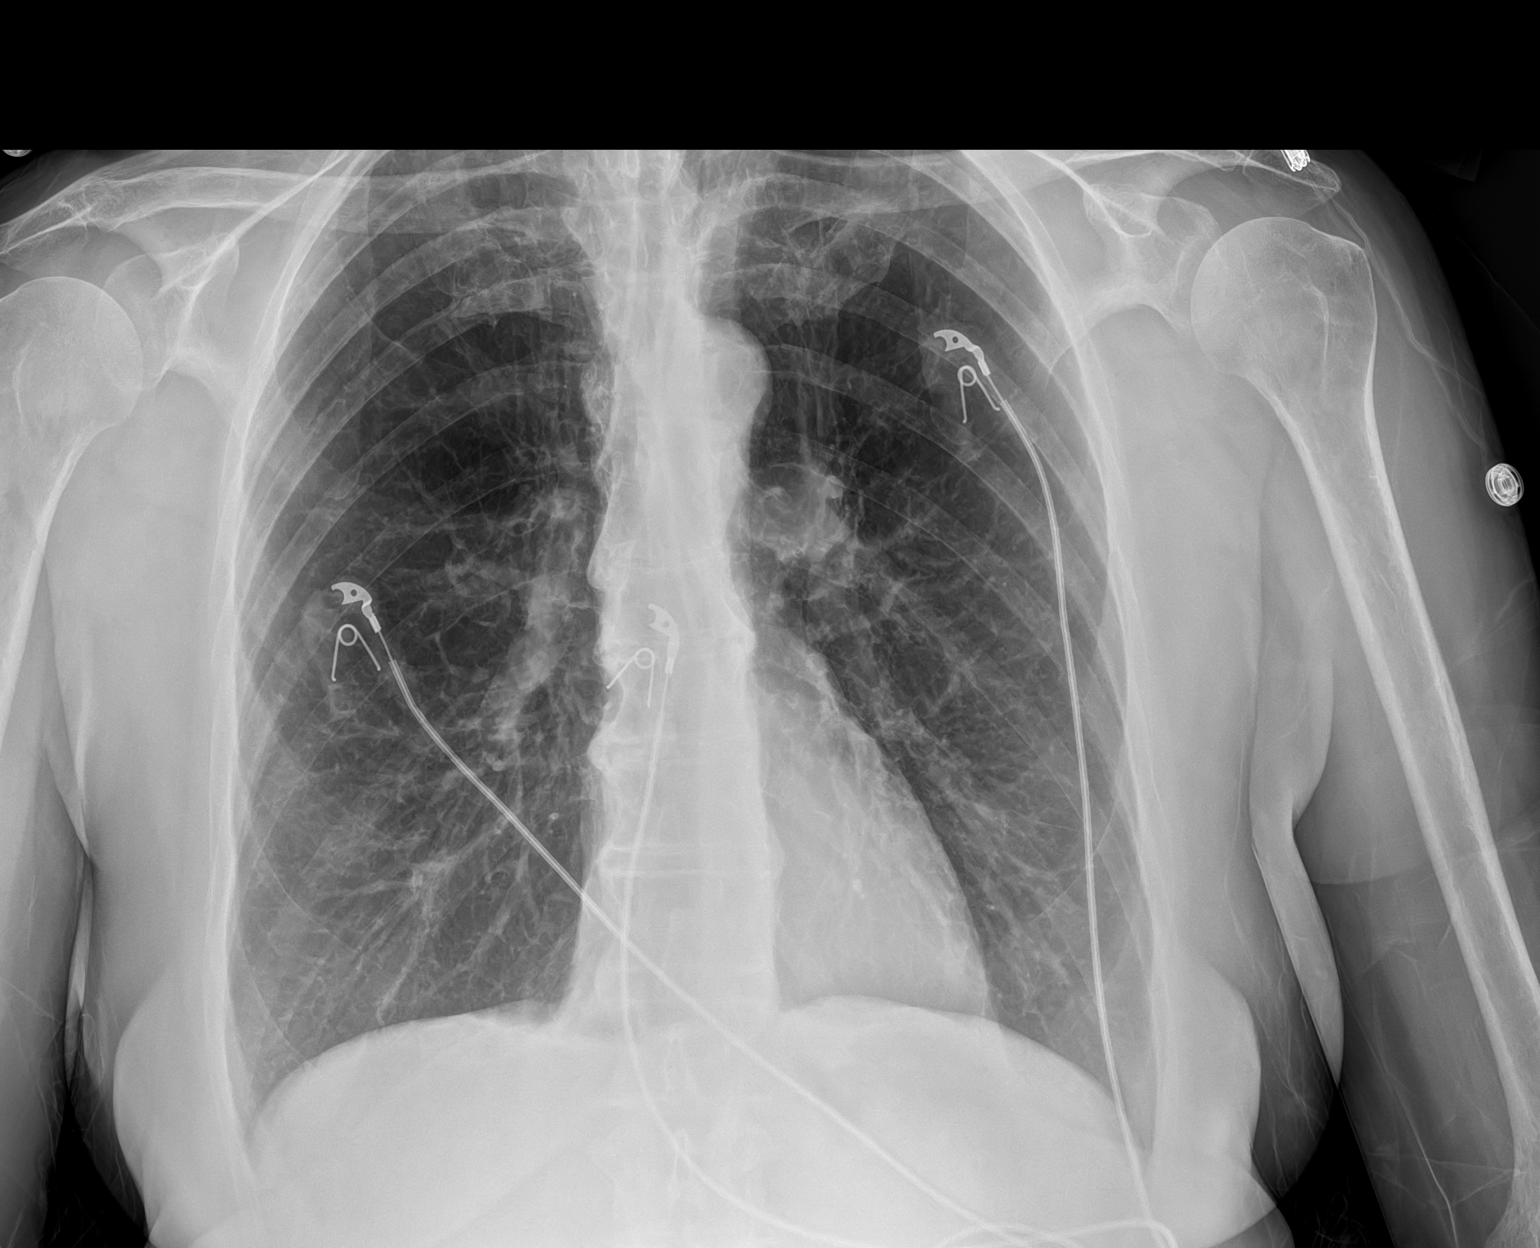

[1 of 1 positions shown; findings below may reference images not displayed]

FINDINGS: The heart size and mediastinal contours are within normal limits. No
pneumothorax or pleural effusion is noted. Emphysematous disease is
noted in both upper lobes. No acute pulmonary disease is noted. The
visualized skeletal structures are unremarkable.
IMPRESSION: No active disease.

Emphysema ([id number]).

## 2021-03-20 DIAGNOSIS — J449 Chronic obstructive pulmonary disease, unspecified: Secondary | ICD-10-CM | POA: Diagnosis not present

## 2021-03-20 DIAGNOSIS — F4542 Pain disorder with related psychological factors: Secondary | ICD-10-CM | POA: Diagnosis not present

## 2021-03-22 DIAGNOSIS — Z1212 Encounter for screening for malignant neoplasm of rectum: Secondary | ICD-10-CM | POA: Diagnosis not present

## 2021-04-19 DIAGNOSIS — F4542 Pain disorder with related psychological factors: Secondary | ICD-10-CM | POA: Diagnosis not present

## 2021-04-19 DIAGNOSIS — J449 Chronic obstructive pulmonary disease, unspecified: Secondary | ICD-10-CM | POA: Diagnosis not present

## 2021-05-16 DIAGNOSIS — M48061 Spinal stenosis, lumbar region without neurogenic claudication: Secondary | ICD-10-CM | POA: Diagnosis not present

## 2021-05-16 DIAGNOSIS — F419 Anxiety disorder, unspecified: Secondary | ICD-10-CM | POA: Diagnosis not present

## 2021-05-16 DIAGNOSIS — K219 Gastro-esophageal reflux disease without esophagitis: Secondary | ICD-10-CM | POA: Diagnosis not present

## 2021-05-16 DIAGNOSIS — E663 Overweight: Secondary | ICD-10-CM | POA: Diagnosis not present

## 2021-05-16 DIAGNOSIS — Z6823 Body mass index (BMI) 23.0-23.9, adult: Secondary | ICD-10-CM | POA: Diagnosis not present

## 2021-05-16 DIAGNOSIS — J449 Chronic obstructive pulmonary disease, unspecified: Secondary | ICD-10-CM | POA: Diagnosis not present

## 2021-05-16 DIAGNOSIS — M810 Age-related osteoporosis without current pathological fracture: Secondary | ICD-10-CM | POA: Diagnosis not present

## 2021-05-16 DIAGNOSIS — G894 Chronic pain syndrome: Secondary | ICD-10-CM | POA: Diagnosis not present

## 2021-07-20 ENCOUNTER — Other Ambulatory Visit: Payer: Self-pay | Admitting: Cardiovascular Disease
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
## 2021-08-02 ENCOUNTER — Other Ambulatory Visit (HOSPITAL_COMMUNITY)
Admission: RE | Admit: 2021-08-02 | Discharge: 2021-08-02 | Disposition: A | Discharge status: Home / Self Care | Payer: PPO | Source: Ambulatory Visit | Attending: Cardiovascular Disease | Admitting: Cardiovascular Disease

## 2021-08-02 ENCOUNTER — Ambulatory Visit: Payer: PPO | Admitting: Cardiovascular Disease

## 2021-08-02 ENCOUNTER — Encounter: Payer: Self-pay | Admitting: Cardiovascular Disease

## 2021-08-02 VITALS — BP 130/60 | HR 81 | Ht 65.0 in | Wt 133.0 lb

## 2021-08-02 DIAGNOSIS — J449 Chronic obstructive pulmonary disease, unspecified: Secondary | ICD-10-CM

## 2021-08-02 DIAGNOSIS — R0602 Shortness of breath: Secondary | ICD-10-CM | POA: Diagnosis not present

## 2021-08-02 DIAGNOSIS — R000 Tachycardia, unspecified: Secondary | ICD-10-CM | POA: Diagnosis not present

## 2021-08-02 DIAGNOSIS — R7989 Other specified abnormal findings of blood chemistry: Secondary | ICD-10-CM | POA: Insufficient documentation

## 2021-08-02 LAB — TSH: TSH: 0.599 u[IU]/mL (ref 0.350–4.500)

## 2021-08-02 MED ORDER — METOPROLOL TARTRATE 25 MG PO TABS: ORAL_TABLET | ORAL | 3 refills | Status: DC

## 2021-08-02 NOTE — Patient Instructions (Signed)
Medication Instructions:  ?Your physician recommends that you continue on your current medications as directed. Please refer to the Current Medication list given to you today. ? ?*If you need a refill on your cardiac medications before your next appointment, please call your pharmacy* ? ? ?Lab Work: ?Your physician recommends that you return for lab work in: Today ( T3, T4, TSH)  ? ?If you have labs (blood work) drawn today and your tests are completely normal, you will receive your results only by: ?MyChart Message (if you have MyChart) OR ?A paper copy in the mail ?If you have any lab test that is abnormal or we need to change your treatment, we will call you to review the results. ? ? ?Testing/Procedures: ?NONE  ? ?Follow-Up: ?At [facility], you and your health needs are our priority.  As part of our continuing mission to provide you with exceptional heart care, we have created designated Provider Care Teams.  These Care Teams include your primary Cardiologist (physician) and Advanced Practice Providers (APPs -  Physician Assistants and Nurse Practitioners) who all work together to provide you with the care you need, when you need it. ? ?We recommend signing up for the patient portal called "MyChart".  Sign up information is provided on this After Visit Summary.  MyChart is used to connect with patients for Virtual Visits (Telemedicine).  Patients are able to view lab/test results, encounter notes, upcoming appointments, etc.  Non-urgent messages can be sent to your provider as well.   ?To learn more about what you can do with MyChart, go to [email or web address].   ? ?Your next appointment:   ?1 year(s) ? ?The format for your next appointment:   ?In Person ? ?Provider:   ?Jenkins Rouge, MD  ? ? ?Other Instructions ?Thank you for choosing [place]! ? ? ? ?Important Information About Sugar ? ? ? ? ? ? ?

## 2021-08-03 LAB — T3: T3, Total: 153 ng/dL (ref 71–180)

## 2021-08-03 LAB — T4: T4, Total: 9.1 ug/dL (ref 4.5–12.0)

## 2021-11-08 ENCOUNTER — Ambulatory Visit
Admission: EM | Admit: 2021-11-08 | Discharge: 2021-11-08 | Disposition: A | Discharge status: Home / Self Care | Payer: PPO | Attending: Nurse Practitioner | Admitting: Nurse Practitioner

## 2021-11-08 DIAGNOSIS — N3000 Acute cystitis without hematuria: Secondary | ICD-10-CM

## 2021-11-08 LAB — POCT URINALYSIS DIP (MANUAL ENTRY)
Bilirubin, UA: NEGATIVE
Glucose, UA: NEGATIVE mg/dL
Ketones, POC UA: NEGATIVE mg/dL
Nitrite, UA: POSITIVE — AB
Protein Ur, POC: 30 mg/dL — AB
Spec Grav, UA: 1.02 (ref 1.010–1.025)
Urobilinogen, UA: 0.2 E.U./dL
pH, UA: 7 (ref 5.0–8.0)

## 2021-11-08 MED ORDER — PHENAZOPYRIDINE HCL 100 MG PO TABS: 100.0000 mg | ORAL_TABLET | Freq: Three times a day (TID) | ORAL | 0 refills | Status: DC | PRN

## 2021-11-08 MED ORDER — AMOXICILLIN-POT CLAVULANATE 875-125 MG PO TABS: 1.0000 | ORAL_TABLET | Freq: Two times a day (BID) | ORAL | 0 refills | Status: DC
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
## 2021-11-08 NOTE — ED Triage Notes (Signed)
Pt reports increase urinary frequency, chills, lower abdominal pressure and burning when urinating x 2-3 days. AZO gives no relief.

## 2021-11-08 NOTE — Discharge Instructions (Addendum)
-  Your urinalysis does show you have a urinary tract infection.  A urine culture is pending to ensure you are being treated with the appropriate antibiotic.  You will be contacted if the medication needs to be changed. -Take medications as prescribed. -Increase fluids. -May take Tylenol for pain, fever, or general discomfort. -Develop a toileting schedule that will allow you to toilet at least every 2 hours. -Avoid caffeine to include tea, soda, and coffee. -Follow-up in the emergency department if you develop fever, chills, worsening abdominal pain, or other concerns.

## 2021-11-11 LAB — URINE CULTURE: Culture: >=100000 — AB

## 2021-11-21 DIAGNOSIS — J439 Emphysema, unspecified: Secondary | ICD-10-CM | POA: Diagnosis not present

## 2021-11-21 DIAGNOSIS — K219 Gastro-esophageal reflux disease without esophagitis: Secondary | ICD-10-CM | POA: Diagnosis not present

## 2021-11-21 DIAGNOSIS — Z9849 Cataract extraction status, unspecified eye: Secondary | ICD-10-CM | POA: Diagnosis not present

## 2021-11-21 DIAGNOSIS — Z9981 Dependence on supplemental oxygen: Secondary | ICD-10-CM | POA: Diagnosis not present

## 2021-11-21 DIAGNOSIS — J9690 Respiratory failure, unspecified, unspecified whether with hypoxia or hypercapnia: Secondary | ICD-10-CM | POA: Diagnosis not present

## 2021-11-21 DIAGNOSIS — Z87891 Personal history of nicotine dependence: Secondary | ICD-10-CM | POA: Diagnosis not present

## 2021-11-21 DIAGNOSIS — I8393 Asymptomatic varicose veins of bilateral lower extremities: Secondary | ICD-10-CM | POA: Diagnosis not present

## 2021-11-21 DIAGNOSIS — I471 Supraventricular tachycardia: Secondary | ICD-10-CM | POA: Diagnosis not present

## 2021-11-21 DIAGNOSIS — I10 Essential (primary) hypertension: Secondary | ICD-10-CM | POA: Diagnosis not present

## 2021-12-25 ENCOUNTER — Telehealth: Payer: Self-pay | Admitting: *Deleted

## 2021-12-25 NOTE — Patient Outreach (Signed)
  Care Coordination   Initial Visit Note   12/25/2021 Name: TUYEN UNCAPHER MRN: [id number] DOB: 10-Oct-1945  TANISHIA LEMASTER is a 76 y.o. year old female who sees Elfredia Nevins, MD for primary care. I spoke with  Selinda Orion by phone today.  What matters to the patients health and wellness today?  My COPD    Goals Addressed             This Visit's Progress    Develop Plan of Care for Management of COPD            SDOH assessments and interventions completed:  Yes     Care Coordination Interventions Activated:  Yes  Care Coordination Interventions:  Yes, provided   Follow up plan: Follow up call scheduled for Kemper Durie [id number] 11:00    Encounter Outcome:  Pt. Visit Completed   Gean Maidens BSN RN [facility] [phone]

## 2022-01-08 DIAGNOSIS — J441 Chronic obstructive pulmonary disease with (acute) exacerbation: Secondary | ICD-10-CM | POA: Diagnosis not present

## 2022-01-08 DIAGNOSIS — J9611 Chronic respiratory failure with hypoxia: Secondary | ICD-10-CM | POA: Diagnosis not present

## 2022-01-08 DIAGNOSIS — M810 Age-related osteoporosis without current pathological fracture: Secondary | ICD-10-CM | POA: Diagnosis not present

## 2022-01-08 DIAGNOSIS — I471 Supraventricular tachycardia: Secondary | ICD-10-CM | POA: Diagnosis not present

## 2022-01-08 DIAGNOSIS — Z6823 Body mass index (BMI) 23.0-23.9, adult: Secondary | ICD-10-CM | POA: Diagnosis not present

## 2022-01-08 DIAGNOSIS — K219 Gastro-esophageal reflux disease without esophagitis: Secondary | ICD-10-CM | POA: Diagnosis not present

## 2022-01-08 DIAGNOSIS — Z1331 Encounter for screening for depression: Secondary | ICD-10-CM | POA: Diagnosis not present

## 2022-01-08 DIAGNOSIS — Z23 Encounter for immunization: Secondary | ICD-10-CM | POA: Diagnosis not present

## 2022-01-08 DIAGNOSIS — Z0001 Encounter for general adult medical examination with abnormal findings: Secondary | ICD-10-CM | POA: Diagnosis not present

## 2022-01-13 ENCOUNTER — Ambulatory Visit: Payer: Self-pay | Admitting: *Deleted

## 2022-01-13 NOTE — Patient Instructions (Signed)
Visit Information  Thank you for taking time to visit with me today. Please don't hesitate to contact me if I can be of assistance to you before our next scheduled telephone appointment.  Following are the goals we discussed today:  Continue oxygen therapy. Continue monitoring oxygen levels .  Our next appointment is by telephone on 11/22  Please call the care guide team at [phone] if you need to cancel or reschedule your appointment.   Please call the Suicide and Crisis Lifeline: 988 call the [place] National Suicide Prevention Lifeline: 5747543895 or TTY: (864)232-4373 TTY (681)064-4321) to talk to a trained counselor call 1-800-273-TALK (toll free, 24 hour hotline) call the [facility]: [phone] call 911 if you are experiencing a Mental Health or [place] or need someone to talk to.  The patient verbalized understanding of instructions, educational materials, and care plan provided today and DECLINED offer to receive copy of patient instructions, educational materials, and care plan.   The patient has been provided with contact information for the care management team and has been advised to call with any health related questions or concerns.   Valente David, RN, MSN, [place] [facility] Care Management Coordinator [phone]

## 2022-01-13 NOTE — Patient Outreach (Signed)
  Care Coordination   Follow Up Visit Note   01/13/2022 Name: Abigail Evans MRN: [id number] DOB: [birth year]  Abigail Evans is a 76 y.o. year old female who sees Redmond School, MD for primary care. I spoke with  [name] by phone today.  What matters to the patients health and wellness today?  Report she had bronchitis last week, provided with medications, feels much better this week.  Continues daily oxygen therapy and oxygen levels are within range.  Denies any urgent concerns, encouraged to contact this care manager with questions.      Goals Addressed             This Visit's Progress    Develop Plan of Care for Management of COPD   On track    Care Coordination Interventions: Provided patient with basic written and verbal COPD education on self care/management/and exacerbation prevention Advised patient to track and manage COPD triggers Provided written and verbal instructions on pursed lip breathing and utilized returned demonstration as teach back Provided instruction about proper use of medications used for management of COPD including inhalers Provided education about and advised patient to utilize infection prevention strategies to reduce risk of respiratory infection         SDOH assessments and interventions completed:  No     Care Coordination Interventions Activated:  Yes  Care Coordination Interventions:  Yes, provided   Follow up plan: Follow up call scheduled for 11/22    Encounter Outcome:  Pt. Visit Completed   Valente David, RN, MSN, [place] [facility] Care Management Coordinator [phone]

## 2022-01-16 DIAGNOSIS — H04123 Dry eye syndrome of bilateral lacrimal glands: Secondary | ICD-10-CM | POA: Diagnosis not present

## 2022-02-14 DIAGNOSIS — J449 Chronic obstructive pulmonary disease, unspecified: Secondary | ICD-10-CM | POA: Diagnosis not present

## 2022-03-12 ENCOUNTER — Ambulatory Visit: Payer: Self-pay | Admitting: *Deleted

## 2022-03-12 ENCOUNTER — Encounter: Payer: Self-pay | Admitting: *Deleted

## 2022-03-12 NOTE — Patient Outreach (Signed)
  Care Coordination   Follow Up Visit Note   03/12/2022 Name: Abigail Evans MRN: [id number] DOB: May 09, 1945  Abigail Evans is a 76 y.o. year old female who sees Elfredia Nevins, MD for primary care. I spoke with  Selinda Orion by phone today.  What matters to the patients health and wellness today?  Management of COPD    Goals Addressed             This Visit's Progress    Develop Plan of Care for Management of COPD       Care Coordination Interventions: Advised patient to track and manage COPD triggers Provided written and verbal instructions on pursed lip breathing and utilized returned demonstration as teach back Provided instruction about proper use of medications used for management of COPD including inhalers Provided education about and advised patient to utilize infection prevention strategies to reduce risk of respiratory infection Discussed the importance of adequate rest and management of fatigue with COPD Assessed social determinant of health barriers Discussed use of 2L O2 via  (floor concentrator) PRN. Patient doesn't like to rely on it, but uses it each morning for about an hour and then more often if needed Reviewed and discussed medications. Symbicort works well but wears off after about 4 to 5 hours. Uses albuterol in between as needed. Discussed that she may benefit from Trelegy since it has 3 medications. She has talked with the PharmD at [facility] office and he recommended the same thing. She will continue to work with him on that and Rx assistance if needed.  Provided with [facility] contact information and encouraged to reach out as needed         SDOH assessments and interventions completed:  Yes  SDOH Interventions Today    Flowsheet Row Most Recent Value  SDOH Interventions   Housing Interventions Intervention Not Indicated  Transportation Interventions Intervention Not Indicated  Financial Strain Interventions Intervention Not Indicated         Care Coordination Interventions Activated:  Yes  Care Coordination Interventions:  Yes, provided   Follow up plan: Follow up call scheduled for 04/10/22    Encounter Outcome:  Pt. Visit Completed   Demetrios Loll, BSN, RN-BC RN Care Coordinator [facility]  [facility] Direct Dial: [phone] Main #: [phone]

## 2022-03-17 DIAGNOSIS — J449 Chronic obstructive pulmonary disease, unspecified: Secondary | ICD-10-CM | POA: Diagnosis not present

## 2022-03-20 DIAGNOSIS — F4542 Pain disorder with related psychological factors: Secondary | ICD-10-CM | POA: Diagnosis not present

## 2022-03-20 DIAGNOSIS — J449 Chronic obstructive pulmonary disease, unspecified: Secondary | ICD-10-CM | POA: Diagnosis not present

## 2022-04-10 ENCOUNTER — Ambulatory Visit: Payer: Self-pay | Admitting: *Deleted

## 2022-04-10 NOTE — Patient Outreach (Signed)
  Care Coordination   Follow Up Visit Note   04/10/2022 Name: Abigail Evans MRN: [id number] DOB: 09/27/1945  Abigail Evans is a 76 y.o. year old female who sees Elfredia Nevins, MD for primary care. I spoke with  Selinda Orion by phone today.  What matters to the patients health and wellness today?  COPD Management    Goals Addressed             This Visit's Progress    Develop Plan of Care for Management of COPD       Care Coordination Interventions: Advised patient to track and manage COPD triggers Provided written and verbal instructions on pursed lip breathing and utilized returned demonstration as teach back Provided instruction about proper use of medications used for management of COPD including inhalers Provided education about and advised patient to utilize infection prevention strategies to reduce risk of respiratory infection Discussed the importance of adequate rest and management of fatigue with COPD Assessed social determinant of health barriers Discussed use of 2L O2 via [place] (floor concentrator) PRN. Patient doesn't like to rely on it, but uses it each morning for about an hour and then more often if needed Discussed activity level. Decreased due to SOB with exertion. She still does her housework while wearing her oxygen and has to rest afterwards. She is not able to do any moderate exercise at this time. Reviewed and discussed medications. Symbicort works well but wears off after about 4 to 5 hours. Uses albuterol in between as needed. Discussed other possible inhalers. She has talked with Maxwell Caul, PharmD with [facility] office about possibly switching to Trelegy but has not been updated on that Reached out to [employer] via email with an update and requested that he follow-up with patient about the inhalers and samples and/or prescription assistance if needed Provided with [facility] contact information and encouraged to reach out as needed         SDOH  assessments and interventions completed:  Yes  SDOH Interventions Today    Flowsheet Row Most Recent Value  SDOH Interventions   Transportation Interventions Intervention Not Indicated  Physical Activity Interventions Other (Comments)  [limitd by COPD]        Care Coordination Interventions:  Yes, provided   Follow up plan: Follow up call scheduled for 05/12/22    Encounter Outcome:  Pt. Visit Completed   Demetrios Loll, BSN, RN-BC RN Care Coordinator [facility]  [facility] Direct Dial: [phone] Main #: [phone]

## 2022-04-16 DIAGNOSIS — J449 Chronic obstructive pulmonary disease, unspecified: Secondary | ICD-10-CM | POA: Diagnosis not present

## 2022-05-12 ENCOUNTER — Ambulatory Visit: Payer: Self-pay | Admitting: *Deleted

## 2022-05-12 ENCOUNTER — Encounter: Payer: Self-pay | Admitting: *Deleted

## 2022-05-12 NOTE — Patient Outreach (Signed)
  Care Coordination   Follow Up Visit Note   05/12/2022 Name: Abigail Evans MRN: [id number] DOB: [birth year]  Abigail Evans is a 77 y.o. year old female who sees Abigail School, MD for primary care. I spoke with  Abigail Evans by phone today.  What matters to the patients health and wellness today?  Managing COPD    Goals Addressed             This Visit's Progress    Develop Plan of Care for Management of COPD       Care Coordination Interventions: Provided education about and advised patient to utilize infection prevention strategies to reduce risk of respiratory infection Assessed social determinant of health barriers Discussed use of 2L O2 via [place] (floor concentrator) PRN. Patient doesn't like to rely on it, but uses it each morning for about an hour and then more often if needed Discussed activity level. Decreased due to SOB with exertion. She still does her housework while wearing her oxygen and has to rest afterwards. She is not able to do any moderate exercise at this time. Reviewed and discussed medications. Symbicort and spiriva respimat work well but she does have to use albuterol once or twice a day in between maintenance doses Reviewed and reconciled medication list to reflect that patient is using both symbicort and spiriva respimat daily as well as albuterol as needed. Collaborated with Abigail Evans, PharmD via email regarding our last correspondence. Advised that patient would like to schedule an appointment with him to discuss med management and possibly rx assistance. Assessed for additional care coordination or resource needs. None identified at this time. Provided with [place] contact information and encouraged to reach out as needed         SDOH assessments and interventions completed:  Yes  SDOH Interventions Today    Flowsheet Row Most Recent Value  SDOH Interventions   Transportation Interventions Intervention Not Indicated  Financial Strain Interventions  Intervention Not Indicated        Care Coordination Interventions:  Yes, provided   Follow up plan: Follow up call scheduled for 06/12/22    Encounter Outcome:  Pt. Visit Completed   Abigail Evans, BSN, RN-BC [place]: [phone] Main #: [phone]

## 2022-05-17 DIAGNOSIS — J449 Chronic obstructive pulmonary disease, unspecified: Secondary | ICD-10-CM | POA: Diagnosis not present

## 2022-06-12 ENCOUNTER — Ambulatory Visit: Payer: Self-pay | Admitting: *Deleted

## 2022-06-12 NOTE — Patient Outreach (Signed)
  Care Coordination   06/12/2022 Name: Abigail Evans MRN: [id number] DOB: Oct 02, 1945   Care Coordination Outreach Attempts:  An unsuccessful telephone outreach was attempted for a scheduled appointment today.  Follow Up Plan:  Additional outreach attempts will be made to offer the patient care coordination information and services.   Encounter Outcome:  No Answer   Care Coordination Interventions:  No, not indicated    Chong Sicilian, BSN, RN-BC RN Care Coordinator [place]: [phone] Main #: [phone]

## 2022-06-17 DIAGNOSIS — J449 Chronic obstructive pulmonary disease, unspecified: Secondary | ICD-10-CM | POA: Diagnosis not present

## 2022-06-19 DIAGNOSIS — J449 Chronic obstructive pulmonary disease, unspecified: Secondary | ICD-10-CM | POA: Diagnosis not present

## 2022-06-19 DIAGNOSIS — E559 Vitamin D deficiency, unspecified: Secondary | ICD-10-CM | POA: Diagnosis not present

## 2022-06-19 DIAGNOSIS — D518 Other vitamin B12 deficiency anemias: Secondary | ICD-10-CM | POA: Diagnosis not present

## 2022-06-19 DIAGNOSIS — F4542 Pain disorder with related psychological factors: Secondary | ICD-10-CM | POA: Diagnosis not present

## 2022-06-19 DIAGNOSIS — Z0001 Encounter for general adult medical examination with abnormal findings: Secondary | ICD-10-CM | POA: Diagnosis not present

## 2022-06-19 DIAGNOSIS — E039 Hypothyroidism, unspecified: Secondary | ICD-10-CM | POA: Diagnosis not present

## 2022-07-07 ENCOUNTER — Encounter: Payer: Self-pay | Admitting: *Deleted

## 2022-07-07 ENCOUNTER — Ambulatory Visit: Payer: Self-pay | Admitting: *Deleted

## 2022-07-07 NOTE — Patient Outreach (Addendum)
  Care Coordination   Follow Up Visit Note   07/07/2022 Name: Abigail Evans MRN: [id number] DOB: [birth year]  Abigail Evans is a 77 y.o. year old female who sees Redmond School, MD for primary care. I spoke with  [name] by phone today.  What matters to the patients health and wellness today?  Managing COPD and getting oxygen re-certified for insurance coverage    Goals Addressed             This Visit's Progress    Manage COPD       Care Coordination Goals: Patient will keep all follow-up PCP and/or pulmonary appointments Patient will call provider with any new or worsening symptoms Patient will track and manage COPD triggers Patient will state understanding of pursed lip breathing for management of symptoms and will use as needed Patient will state understanding of proper use of medications used for management of COPD including inhalers Patient will use medications as directed by PCP or pulmonologist  Patient will engage in light exercise as tolerated 3-5 days a week to aid in the the management of COPD Provided will use infection prevention strategies to reduce risk of respiratory infection Patient will state understanding of the importance of adequate rest and management of fatigue with COPD Patient will continue to use O2 at 2 L per minute PRN by [place] via floor concentrator Patient will reach out to PCP regarding re \-certification for O2 through [place] (has received insurance denial). May need to schedule an office visit. Patient will complete prescription assistance application for [employer] for Breztri when before her current approval for assistance runs out 04/21/2023. Patient will work with Nicholas Lose, PharmD as needed for assistance with this. Patient will call RN Care Coordinator [phone] with any care coordination or resource needs          SDOH assessments and interventions completed:  Yes  SDOH Interventions Today    Flowsheet Row Most Recent  Value  SDOH Interventions   Housing Interventions Intervention Not Indicated  Transportation Interventions Intervention Not Indicated  Financial Strain Interventions Intervention Not Indicated        Care Coordination Interventions:  Yes, provided  Interventions Today    Flowsheet Row Most Recent Value  Chronic Disease   Chronic disease during today's visit Chronic Obstructive Pulmonary Disease (COPD)  General Interventions   General Interventions Discussed/Reviewed General Interventions Discussed, General Interventions Reviewed, Doctor Visits, Communication with  Doctor Visits Discussed/Reviewed Doctor Visits Discussed, Doctor Visits Reviewed, Specialist  Lexine Baton out to PCP regarding recertification for oxygen through [place]. May need to schedule an office visit.]  PCP/Specialist Visits Compliance with follow-up visit  Communication with PCP/Specialists  [PCP office regarding need for oxygen re-certicication for insurance coverage]  Pharmacy Interventions   Pharmacy Dicussed/Reviewed Medications and their functions  [received prescription assistance through [place] for Breztri]       Follow up plan: Follow up call scheduled for 07/14/22    Encounter Outcome:  Pt. Visit Completed   Chong Sicilian, BSN, RN-BC RN Care Coordinator [place]: [phone] Main #: [phone]

## 2022-07-14 ENCOUNTER — Ambulatory Visit: Payer: Self-pay | Admitting: *Deleted

## 2022-07-14 ENCOUNTER — Encounter: Payer: Self-pay | Admitting: *Deleted

## 2022-07-14 NOTE — Patient Outreach (Signed)
  Care Coordination   Follow Up Visit Note   07/14/2022 Name: Abigail Evans MRN: [id number] DOB: 16-Aug-1945  Abigail Evans is a 77 y.o. year old female who sees Redmond School, MD for primary care. I spoke with  [name] by phone today.  What matters to the patients health and wellness today?  Obtaining recertification for O2    Goals Addressed             This Visit's Progress    Manage COPD       Care Coordination Goals: Patient will keep all follow-up PCP and/or pulmonary appointments Patient will call provider with any new or worsening symptoms Patient will track and manage COPD triggers Patient will state understanding of pursed lip breathing for management of symptoms and will use as needed Patient will state understanding of proper use of medications used for management of COPD including inhalers Patient will use medications as directed by PCP or pulmonologist  Patient will engage in light exercise as tolerated 3-5 days a week to aid in the the management of COPD Provided will use infection prevention strategies to reduce risk of respiratory infection Patient will state understanding of the importance of adequate rest and management of fatigue with COPD Patient will continue to use O2 at 2 L per minute PRN by [place] via floor concentrator Patient will follow-up with PCP regarding recertification for O2 through [place] (has received insurance EOBs that show non-coverage).  Last certification was done 09/2021.  Has a PCP appt scheduled for 09/2022.  Has not received a bill from [place]. Patient will complete prescription assistance application for [employer] for Breztri when before her current approval for assistance runs out 04/21/2023. Patient will work with Nicholas Lose, PharmD as needed for assistance with this. Patient will call RN Care Coordinator [phone] with any care coordination or resource needs          SDOH assessments and interventions completed:   No   Care Coordination Interventions:  Yes, provided  Interventions Today    Flowsheet Row Most Recent Value  Chronic Disease   Chronic disease during today's visit Chronic Obstructive Pulmonary Disease (COPD)  General Interventions   General Interventions Discussed/Reviewed Doctor Visits, Durable Medical Equipment (DME)  Doctor Visits Discussed/Reviewed Doctor Visits Discussed, PCP  Durable Medical Equipment (DME) Oxygen  PCP/Specialist Visits Compliance with follow-up visit       Follow up plan: Follow up call scheduled for 08/14/22    Encounter Outcome:  Pt. Visit Completed   Chong Sicilian, BSN, RN-BC RN Care Coordinator [place]: [phone] Main #: [phone]

## 2022-07-16 DIAGNOSIS — J449 Chronic obstructive pulmonary disease, unspecified: Secondary | ICD-10-CM | POA: Diagnosis not present

## 2022-08-13 DIAGNOSIS — J449 Chronic obstructive pulmonary disease, unspecified: Secondary | ICD-10-CM | POA: Diagnosis not present

## 2022-08-13 DIAGNOSIS — Z6823 Body mass index (BMI) 23.0-23.9, adult: Secondary | ICD-10-CM | POA: Diagnosis not present

## 2022-08-13 DIAGNOSIS — M67479 Ganglion, unspecified ankle and foot: Secondary | ICD-10-CM | POA: Diagnosis not present

## 2022-08-13 DIAGNOSIS — M810 Age-related osteoporosis without current pathological fracture: Secondary | ICD-10-CM | POA: Diagnosis not present

## 2022-08-13 DIAGNOSIS — J9611 Chronic respiratory failure with hypoxia: Secondary | ICD-10-CM | POA: Diagnosis not present

## 2022-08-13 DIAGNOSIS — M48061 Spinal stenosis, lumbar region without neurogenic claudication: Secondary | ICD-10-CM | POA: Diagnosis not present

## 2022-08-13 DIAGNOSIS — K219 Gastro-esophageal reflux disease without esophagitis: Secondary | ICD-10-CM | POA: Diagnosis not present

## 2022-08-14 ENCOUNTER — Encounter: Payer: Self-pay | Admitting: *Deleted

## 2022-08-16 DIAGNOSIS — J449 Chronic obstructive pulmonary disease, unspecified: Secondary | ICD-10-CM | POA: Diagnosis not present

## 2022-08-20 ENCOUNTER — Ambulatory Visit: Payer: Self-pay | Admitting: *Deleted
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
## 2022-08-26 ENCOUNTER — Other Ambulatory Visit: Payer: Self-pay

## 2022-08-26 DIAGNOSIS — J441 Chronic obstructive pulmonary disease with (acute) exacerbation: Secondary | ICD-10-CM

## 2022-09-01 ENCOUNTER — Ambulatory Visit: Payer: PPO | Attending: Cardiovascular Disease | Admitting: Cardiovascular Disease

## 2022-09-01 ENCOUNTER — Encounter: Payer: Self-pay | Admitting: Cardiovascular Disease

## 2022-09-01 VITALS — BP 140/72 | HR 76 | Ht 65.0 in | Wt 133.4 lb

## 2022-09-01 DIAGNOSIS — R7989 Other specified abnormal findings of blood chemistry: Secondary | ICD-10-CM

## 2022-09-01 DIAGNOSIS — R0602 Shortness of breath: Secondary | ICD-10-CM | POA: Diagnosis not present

## 2022-09-01 DIAGNOSIS — I4719 Other supraventricular tachycardia: Secondary | ICD-10-CM | POA: Diagnosis not present

## 2022-09-01 DIAGNOSIS — J441 Chronic obstructive pulmonary disease with (acute) exacerbation: Secondary | ICD-10-CM | POA: Diagnosis not present

## 2022-09-01 MED ORDER — METOPROLOL TARTRATE 25 MG PO TABS: ORAL_TABLET | ORAL | 3 refills | Status: DC

## 2022-09-01 NOTE — Patient Instructions (Signed)
Medication Instructions:  Your physician recommends that you continue on your current medications as directed. Please refer to the Current Medication list given to you today.  *If you need a refill on your cardiac medications before your next appointment, please call your pharmacy*   Lab Work: None If you have labs (blood work) drawn today and your tests are completely normal, you will receive your results only by: MyChart Message (if you have MyChart) OR A paper copy in the mail If you have any lab test that is abnormal or we need to change your treatment, we will call you to review the results.   Testing/Procedures: None   Follow-Up: At [facility], you and your health needs are our priority.  As part of our continuing mission to provide you with exceptional heart care, we have created designated Provider Care Teams.  These Care Teams include your primary Cardiologist (physician) and Advanced Practice Providers (APPs -  Physician Assistants and Nurse Practitioners) who all work together to provide you with the care you need, when you need it.  We recommend signing up for the patient portal called "MyChart".  Sign up information is provided on this After Visit Summary.  MyChart is used to connect with patients for Virtual Visits (Telemedicine).  Patients are able to view lab/test results, encounter notes, upcoming appointments, etc.  Non-urgent messages can be sent to your provider as well.   To learn more about what you can do with MyChart, go to [email or web address].    Your next appointment:   1 year(s)  Provider:   Charlton Haws, MD    Other Instructions

## 2022-09-15 DIAGNOSIS — J449 Chronic obstructive pulmonary disease, unspecified: Secondary | ICD-10-CM | POA: Diagnosis not present

## 2022-09-19 NOTE — Patient Outreach (Signed)
  Care Coordination   Follow Up Visit Note   08/20/2022 Name: Abigail Evans MRN: [id number] DOB: [birth year]  ANNAALICIA Evans is a 77 y.o. year old female who sees Elfredia Nevins, MD for primary care. I spoke with  Selinda Orion by phone today.  What matters to the patients health and wellness today?  Managing COPD    Goals Addressed             This Visit's Progress    Manage COPD       Care Coordination Goals: Patient will keep all follow-up PCP and/or pulmonary appointments Patient will call provider with any new or worsening symptoms Patient will track and manage COPD triggers Patient will use medications as directed by PCP or pulmonologist  Patient will engage in light exercise as tolerated 3-5 days a week to aid in the the management of COPD Provided will use infection prevention strategies to reduce risk of respiratory infection Patient will state understanding of the importance of adequate rest and management of fatigue with COPD Patient will continue to use O2 at 2 L per minute PRN by [employer] via floor concentrator Patient will follow-up with PCP regarding recertification for O2 through [employer] (has received insurance EOBs that show non-coverage).  Last certification was done 09/2021.  Has a PCP appt scheduled for 09/2022.  Has not received a bill from [place]. Patient will call RN Care Coordinator [phone] with any care coordination or resource needs          SDOH assessments and interventions completed:  Yes  SDOH Interventions Today    Flowsheet Row Most Recent Value  SDOH Interventions   Transportation Interventions Intervention Not Indicated  Financial Strain Interventions Intervention Not Indicated        Care Coordination Interventions:  Yes, provided  Interventions Today    Flowsheet Row Most Recent Value  Chronic Disease   Chronic disease during today's visit Chronic Obstructive Pulmonary Disease (COPD)  General Interventions   General  Interventions Discussed/Reviewed General Interventions Discussed, General Interventions Reviewed, Labs, Doctor Visits  Doctor Visits Discussed/Reviewed Specialist, Doctor Visits Discussed, Doctor Visits Reviewed, PCP  PCP/Specialist Visits Compliance with follow-up visit  Exercise Interventions   Exercise Discussed/Reviewed Physical Activity  Physical Activity Discussed/Reviewed Physical Activity Discussed, Physical Activity Reviewed, Home Exercise Program (HEP)  Education Interventions   Education Provided Provided Education  Provided Verbal Education On When to see the doctor, Mental Health/Coping with Illness, Medication, Exercise  Pharmacy Interventions   Pharmacy Dicussed/Reviewed Pharmacy Topics Discussed, Medications and their functions  Safety Interventions   Safety Discussed/Reviewed Safety Discussed, Safety Reviewed       Follow up plan: Follow up call scheduled for 10/14/22    Encounter Outcome:  Pt. Visit Completed   Demetrios Loll, BSN, RN-BC RN Care Coordinator [facility]  [facility] Direct Dial: [phone] Main #: [phone]

## 2022-10-08 NOTE — Progress Notes (Signed)
Abigail Evans, female    DOB: 1945/07/22    MRN: [id number]   Brief patient profile:  53 yowf  quit smoking 2023 / retired from house cleaning 2019  referred to pulmonary clinic in [place]  10/09/2022 by Abigail Evans for copd evaluation/ 02 dep since Jun 2021 admit:      Admit date: 10/13/2019 Discharge date: 10/16/2019    Discharge Diagnoses:    GERD (gastroesophageal reflux disease)   COPD exacerbation (HCC)   Atrial tachycardia (HCC)   Acute respiratory failure with hypoxia (HCC)   Acute respiratory failure (HCC)   Abnormal TSH             History of present illness:  As per H&P written by Dr. Randol Evans on 10/13/2019  Abigail Evans  is a 77 y.o. female, COPD, GERD, recent hospitalization May, where she was discharged with home oxygen, but this was discontinued by as she did not require any further oxygen, patient was seen by cardiology recently given sinus tachycardia/SVT during recent hospitalization, no further work-up indicated per cardiology given normal echo and BNP, with abnormal TSH felt contributing to her symptoms. -Bradycardia secondary to complaints of shortness of breath, and wheezing, over last 4 days, as well reports cough, with a productive sputum, the symptoms resembles recent hospitalization, she was seen recently by cardiologist where she was resumed back on her Lopressor, she reports her dyspnea and wheezing preceding the initiation of her metoprolol, she denies hemoptysis, fever, chills, nausea, vomiting, chest pain and dizziness, patient reports she did not receive her COVID-19 vaccine. -In ED she was significantly tachypneic on presentation, she received IV steroids, and continues nebulizer treatment, magnesium sulfate, and despite that she remains hypoxic 85% on room air with activity, tachypneic as well, chest x-ray with no evidence of infection, [facility] hospitalist was consulted to admit.   Hospital Course:  1-acute hypoxic respiratory failure due to COPD  exacerbation and bronchiectasis -Patient 85% on room air especially with ambulation -Good O2 sat maintained on 2 L nasal cannula supplementation. -Improved air movement bilaterally, very little expiratory wheezing at time of discharge and able to speak in full sentences. -Continue with steroids tapering, start symbicort BID and continue daily spiriva. Patient will also continue albuterol rescue bronchodilator management and will complete antibiotics (cefdinir) as instructed. -Patient will benefit of outpatient evaluation by pulmonologist for PFTs and further adjustment to maintenance therapy.     2-GERD (gastroesophageal reflux disease) -Continue PRN PPI -no reflux symptoms reported.   3-abnormal thyroid panel results -Elevated free T4 and normal TSH appreciated currently -Subclinical hyperthyroidism most likely. -Repeat thyroid panel in 4 weeks and if needed will Recommend outpatient follow-up with endocrinology service. -no thyromegaly seen   4-history of SVT -Continue outpatient follow-up with cardiology service. -Currently rate controlled. -Continue metoprolol.        Discharge Instructions         Discharge Instructions     Diet - low sodium heart healthy   Complete by: As directed      Discharge instructions   Complete by: As directed      Take medications as prescribed Maintain adequate hydration Remember to properly rinse mouth after using inhalers Follow heart healthy diet Follow-up with pulmonologist as instructed Follow-up with PCP in 10 days for hospital follow up.       History of Present Illness  10/09/2022  Pulmonary/ 1st office eval/ Abigail Evans / [place] [facility]  Chief Complaint  Patient presents with   Consult  Dyspnea:  since 09/2019  stays at home x to shop cross parking lot and [place] store then has to stop  Cough: more  in am p inhalers > clear mucus Sleep: flat bed 2 pillows or can't breath SABA use: 1-2 puffs per day hfa 02: since  09/2019 but portable is too heavy  Uses 2lpm   No obvious day to day or daytime pattern/variability or assoc excess/ purulent sputum or mucus plugs or hemoptysis or cp or chest tightness, subjective wheeze or overt sinus or hb symptoms.   Sleeping  without nocturnal  or early am exacerbation  of respiratory  c/o's or need for noct saba. Also denies any obvious fluctuation of symptoms with weather or environmental changes or other aggravating or alleviating factors except as outlined above   No unusual exposure hx or h/o childhood pna/ asthma or knowledge of premature birth.  Current Allergies, Complete Past Medical History, Past Surgical History, Family History, and Social History were reviewed in [employer] record.  ROS  The following are not active complaints unless bolded Hoarseness, sore throat, dysphagia, dental problems, itching, sneezing,  nasal congestion or discharge of excess mucus or purulent secretions, ear ache,   fever, chills, sweats, unintended wt loss or wt gain, classically pleuritic or exertional cp,  orthopnea pnd or arm/hand swelling  or leg swelling, presyncope, palpitations, abdominal pain, anorexia, nausea, vomiting, diarrhea  or change in bowel habits or change in bladder habits, change in stools or change in urine, dysuria, hematuria,  rash, arthralgias, visual complaints, headache, numbness, weakness or ataxia or problems with walking or coordination,  change in mood or  memory.              Outpatient Medications Prior to Visit  Medication Sig Dispense Refill   albuterol (VENTOLIN HFA) 108 (90 Base) MCG/ACT inhaler Inhale 1 puff into the lungs 4 (four) times daily. 1 puff every 4 to 6 hours as nedded     amoxicillin-clavulanate (AUGMENTIN) 875-125 MG tablet Take 1 tablet by mouth every 12 (twelve) hours. (Patient not taking: Reported on 05/12/2022) 14 tablet 0   budesonide-formoterol (SYMBICORT) 160-4.5 MCG/ACT inhaler Inhale 2 puffs into the  lungs in the morning and at bedtime. (Patient not taking: Reported on 09/01/2022) 1 Inhaler 3   cholecalciferol (VITAMIN D) 25 MCG (1000 UNIT) tablet Take 1,000 Units by mouth daily.     metoprolol tartrate (LOPRESSOR) 25 MG tablet TAKE (1) TABLET BY MOUTH TWICE DAILY. 180 tablet 3   phenazopyridine (PYRIDIUM) 100 MG tablet Take 1 tablet (100 mg total) by mouth 3 (three) times daily as needed for pain. (Patient not taking: Reported on 09/01/2022) 10 tablet 0   SPIRIVA RESPIMAT 2.5 MCG/ACT AERS  Not taking too expensive      No facility-administered medications prior to visit.    Past Medical History:  Diagnosis Date   Arthritis    COPD (chronic obstructive pulmonary disease) (HCC)    Dysphagia    GERD (gastroesophageal reflux disease)       Objective:     BP 122/60   Pulse 75   Ht 5\' 5"  (1.651 m)   Wt 132 lb 6.4 oz (60.1 kg)   SpO2 (!) 88% Comment: RA  BMI 22.03 kg/m   SpO2: (!) 88 % (RA)  Pleasant amb thin wf classic pseudowheeze better with purse lip breathing   HEENT :  Oropharynx  clear   Nasal turbinates nl    NECK :  without JVD/Nodes/TM/ nl carotid upstrokes bilaterally  LUNGS: no acc muscle use,  Mod barrel  contour chest wall with bilateral  Distant bs s audible wheeze and  without cough on insp or exp maneuvers and mod  Hyperresonant  to  percussion bilaterally     CV:  RRR  no s3 or murmur or increase in P2, and no edema   ABD:  soft and nontender with pos mid insp Hoover's  in the supine position. No bruits or organomegaly appreciated, bowel sounds nl  MS:   Ext warm without deformities or   obvious joint restrictions , calf tenderness, cyanosis or clubbing  SKIN: warm and dry without lesions    NEURO:  alert, approp, nl sensorium with  no motor or cerebellar deficits apparent.                Assessment   COPD GOLD ? Quit smoking 2023  - 10/09/2022  After extensive coaching inhaler device,  effectiveness =    60% from a baseline of 30% (short  Ti) > continue symbiocrt 160 due to cough and add gerd rx - Allergy screen 10/09/2022 >  Eos Pending  - Alpha one AT phenotype  10/09/2022 = pending   DDX of  difficult airways management almost all start with A and  include Adherence, Ace Inhibitors, Acid Reflux, Active Sinus Disease, Alpha 1 Antitripsin deficiency, Anxiety masquerading as Airways dz,  ABPA,  Allergy(esp in young), Aspiration (esp in elderly), Adverse effects of meds,  Active smoking or vaping, A bunch of PE's (a small clot burden can't cause this syndrome unless there is already severe underlying pulm or vascular dz with poor reserve) plus two Bs  = Bronchiectasis and Beta blocker use..and one C= CHF  Adherence is always the initial "prime suspect" and is a multilayered concern that requires a "trust but verify" approach in every patient - starting with knowing how to use medications, especially inhalers, correctly, keeping up with refills and understanding the fundamental difference between maintenance and prns vs those medications only taken for a very short course and then stopped and not refilled. - see hfa teaching - return with all meds in hand using a trust but verify approach to confirm accurate Medication  Reconciliation The principal here is that until we are certain that the  patients are doing what we've asked, it makes no sense to ask them to do more.   ? Acid (or non-acid) GERD > always difficult to exclude as up to 75% of pts in some series report no assoc GI/ Heartburn symptoms> rec max (24h)  acid suppression and diet restrictions/ reviewed and instructions given in writing.   ? Allergy/ABPA  component > check Eos   ? Alpha one Antitrypsin def > check phenotype      Chronic respiratory failure with hypoxia (HCC) 10/09/2022   Walked on RA  x 1   lap(s) =  approx 150 ft  @ mod pace, stopped due to desats to 87% placed on 2lpm x 2nd lap  with lowest 02 sats 92%   10/09/2022 rec 2lpm 24/7   Also rec: Make sure you  check your oxygen saturation  AT  your highest level of activity (not after you stop)   to be sure it stays over 90% and adjust  02 flow upward to maintain this level if needed but remember to turn it back to previous settings when you stop (to conserve your supply).   Each maintenance medication was reviewed in detail including emphasizing most importantly the difference  between maintenance and prns and under what circumstances the prns are to be triggered using an action plan format where appropriate.  Total time for H and P, chart review, counseling, reviewing 02 device(s) , directly observing portions of ambulatory 02 saturation study/ and generating customized AVS unique to this office visit / same day charting  > 45 min pt new to me                   Sandrea Hughs, MD 10/09/2022

## 2022-10-09 ENCOUNTER — Ambulatory Visit (INDEPENDENT_AMBULATORY_CARE_PROVIDER_SITE_OTHER): Payer: PPO | Admitting: Internal Medicine

## 2022-10-09 ENCOUNTER — Encounter: Payer: Self-pay | Admitting: Internal Medicine

## 2022-10-09 VITALS — BP 122/60 | HR 75 | Ht 65.0 in | Wt 132.4 lb

## 2022-10-09 DIAGNOSIS — J9611 Chronic respiratory failure with hypoxia: Secondary | ICD-10-CM | POA: Diagnosis not present

## 2022-10-09 DIAGNOSIS — J449 Chronic obstructive pulmonary disease, unspecified: Secondary | ICD-10-CM | POA: Diagnosis not present

## 2022-10-09 MED ORDER — FAMOTIDINE 20 MG PO TABS: ORAL_TABLET | ORAL | 11 refills | Status: DC

## 2022-10-09 MED ORDER — PANTOPRAZOLE SODIUM 40 MG PO TBEC: 40.0000 mg | DELAYED_RELEASE_TABLET | Freq: Every day | ORAL | 2 refills | Status: DC

## 2022-10-09 NOTE — Patient Instructions (Addendum)
Plan A = Automatic = Always=    Symbicort 160 Take 2 puffs first thing in am and then another 2 puffs about 12 hours later.    Work on inhaler technique:  relax and gently blow all the way out then take a nice smooth full deep breath back in, triggering the inhaler at same time you start breathing in.  Hold breath in for at least  5 seconds if you can. Blow out symbicort  thru nose. Rinse and gargle with water when done.  If mouth or throat bother you at all,  try brushing teeth/gums/tongue with arm and hammer toothpaste/ make a slurry and gargle and spit out.  >>>  Remember how golfers warm up by taking practice swings - do this with an empty inhaler     Plan B = Backup (to supplement plan A, not to replace it) Only use your albuterol inhaler as a rescue medication to be used if you can't catch your breath by resting or doing a relaxed purse lip breathing pattern.  - The less you use it, the better it will work when you need it. - Ok to use the inhaler up to 2 puffs  every 4 hours if you must but call for appointment if use goes up over your usual need - Don't leave home without it !!  (think of it like the spare tire for your car)   Make sure you check your oxygen saturation  AT  your highest level of activity (not after you stop)   to be sure it stays over 90% and adjust  02 flow upward to maintain this level if needed but remember to turn it back to previous settings when you stop (to conserve your supply).   Pantoprazole (protonix) 40 mg   Take  30-60 min before first meal of the day and Pepcid (famotidine)  20 mg after supper until return to office - this is the best way to tell whether stomach acid is contributing to your problem.    Please schedule a follow up office visit in 6 weeks, call sooner if needed with all medications /inhalers/ solutions in hand so we can verify exactly what you are taking. This includes all medications from all doctors and over the counters

## 2022-10-09 NOTE — Assessment & Plan Note (Addendum)
Quit smoking 2023  - 10/09/2022  After extensive coaching inhaler device,  effectiveness =    60% from a baseline of 30% (short Ti) > continue symbiocrt 160 due to cough and add gerd rx - Allergy screen 10/09/2022 >  Eos Pending  - Alpha one AT phenotype  10/09/2022 = pending   DDX of  difficult airways management almost all start with A and  include Adherence, Ace Inhibitors, Acid Reflux, Active Sinus Disease, Alpha 1 Antitripsin deficiency, Anxiety masquerading as Airways dz,  ABPA,  Allergy(esp in young), Aspiration (esp in elderly), Adverse effects of meds,  Active smoking or vaping, A bunch of PE's (a small clot burden can't cause this syndrome unless there is already severe underlying pulm or vascular dz with poor reserve) plus two Bs  = Bronchiectasis and Beta blocker use..and one C= CHF  Adherence is always the initial "prime suspect" and is a multilayered concern that requires a "trust but verify" approach in every patient - starting with knowing how to use medications, especially inhalers, correctly, keeping up with refills and understanding the fundamental difference between maintenance and prns vs those medications only taken for a very short course and then stopped and not refilled. - see hfa teaching - return with all meds in hand using a trust but verify approach to confirm accurate Medication  Reconciliation The principal here is that until we are certain that the  patients are doing what we've asked, it makes no sense to ask them to do more.   ? Acid (or non-acid) GERD > always difficult to exclude as up to 75% of pts in some series report no assoc GI/ Heartburn symptoms> rec max (24h)  acid suppression and diet restrictions/ reviewed and instructions given in writing.   ? Allergy/ABPA  component > check Eos   ? Alpha one Antitrypsin def > check phenotype

## 2022-10-10 ENCOUNTER — Telehealth: Payer: Self-pay | Admitting: Internal Medicine

## 2022-10-10 NOTE — Telephone Encounter (Signed)
Rec'd message from Cyndi Lennert with [employer] RE: patient walked in office today qualified for 02 Received: Today Coltrane, Bryson Dames, Jacqlyn Krauss, [place]; Pernell Dupre, Melissa L This patient already receives O2, her Rx is 2lpm she does not qualify for a POC, she does not use her portable tanks regularly. The last time she ordered any was August of 2023. She will need to use 8 tanks per month for 3 consecutive months to qualify. I will reach out to the patient to inform.

## 2022-10-12 DIAGNOSIS — J9611 Chronic respiratory failure with hypoxia: Secondary | ICD-10-CM | POA: Insufficient documentation

## 2022-10-12 NOTE — Assessment & Plan Note (Signed)
10/09/2022   Walked on RA  x 1   lap(s) =  approx 150 ft  @ mod pace, stopped due to desats to 87% placed on 2lpm x 2nd lap  with lowest 02 sats 92%   10/09/2022 rec 2lpm 24/7   Also rec: Make sure you check your oxygen saturation  AT  your highest level of activity (not after you stop)   to be sure it stays over 90% and adjust  02 flow upward to maintain this level if needed but remember to turn it back to previous settings when you stop (to conserve your supply).   Each maintenance medication was reviewed in detail including emphasizing most importantly the difference between maintenance and prns and under what circumstances the prns are to be triggered using an action plan format where appropriate.  Total time for H and P, chart review, counseling, reviewing 02 device(s) , directly observing portions of ambulatory 02 saturation study/ and generating customized AVS unique to this office visit / same day charting  > 45 min pt new to me

## 2022-10-14 ENCOUNTER — Encounter: Payer: Self-pay | Admitting: *Deleted

## 2022-10-14 ENCOUNTER — Telehealth: Payer: Self-pay | Admitting: *Deleted

## 2022-10-14 LAB — BASIC METABOLIC PANEL
BUN/Creatinine Ratio: 14 (ref 12–28)
BUN: 11 mg/dL (ref 8–27)
CO2: 25 mmol/L (ref 20–29)
Calcium: 9.6 mg/dL (ref 8.7–10.3)
Chloride: 99 mmol/L (ref 96–106)
Creatinine, Ser: 0.77 mg/dL (ref 0.57–1.00)
Glucose: 80 mg/dL (ref 70–99)
Potassium: 4.7 mmol/L (ref 3.5–5.2)
Sodium: 137 mmol/L (ref 134–144)
eGFR: 79 mL/min/{1.73_m2} (ref >59)

## 2022-10-14 LAB — CBC WITH DIFFERENTIAL/PLATELET
Basophils Absolute: 0 10*3/uL (ref 0.0–0.2)
Basos: 1 %
EOS (ABSOLUTE): 0.5 10*3/uL — ABNORMAL HIGH (ref 0.0–0.4)
Eos: 7 %
Hematocrit: 43.5 % (ref 34.0–46.6)
Hemoglobin: 14.3 g/dL (ref 11.1–15.9)
Immature Grans (Abs): 0 10*3/uL (ref 0.0–0.1)
Immature Granulocytes: 0 %
Lymphocytes Absolute: 1.8 10*3/uL (ref 0.7–3.1)
Lymphs: 28 %
MCH: 32 pg (ref 26.6–33.0)
MCHC: 32.9 g/dL (ref 31.5–35.7)
MCV: 97 fL (ref 79–97)
Monocytes Absolute: 0.7 10*3/uL (ref 0.1–0.9)
Monocytes: 11 %
Neutrophils Absolute: 3.3 10*3/uL (ref 1.4–7.0)
Neutrophils: 53 %
Platelets: 252 10*3/uL (ref 150–450)
RBC: 4.47 x10E6/uL (ref 3.77–5.28)
RDW: 12.1 % (ref 11.7–15.4)
WBC: 6.3 10*3/uL (ref 3.4–10.8)

## 2022-10-14 LAB — ALPHA-1-ANTITRYPSIN PHENOTYP: A-1 Antitrypsin: 165 mg/dL (ref 101–187)

## 2022-10-14 NOTE — Progress Notes (Signed)
  Care Coordination Note  10/14/2022 Name: KEWANA SANON MRN: [id number] DOB: January 25, 1946  [name] is a 77 y.o. year old female who is a primary care patient of Elfredia Nevins, MD and is actively engaged with the care management team. I reached out to Selinda Orion by phone today to assist with re-scheduling a follow up visit with the RN Case Manager  Follow up plan: Unsuccessful telephone outreach attempt made.   [facility]  Care Coordination Care Guide  Direct Dial: [phone]

## 2022-10-16 DIAGNOSIS — J449 Chronic obstructive pulmonary disease, unspecified: Secondary | ICD-10-CM | POA: Diagnosis not present

## 2022-10-21 NOTE — Progress Notes (Signed)
  Care Coordination Note  10/21/2022 Name: [name] MRN: [id number] DOB: August 10, 1945  Abigail Evans is a 77 y.o. year old female who is a primary care patient of Elfredia Nevins, MD and is actively engaged with the care management team. I reached out to Selinda Orion by phone today to assist with re-scheduling a follow up visit with the RN Case Manager  Follow up plan: Telephone appointment with care management team member scheduled for:10/24/22  [facility] Coordination Care Guide  Direct Dial: [phone]

## 2022-10-24 ENCOUNTER — Encounter: Payer: Self-pay | Admitting: *Deleted

## 2022-10-24 ENCOUNTER — Ambulatory Visit: Payer: Self-pay | Admitting: *Deleted

## 2022-10-24 NOTE — Patient Outreach (Signed)
  Care Coordination   Follow Up Visit Note   10/24/2022 Name: Abigail Evans MRN: [id number] DOB: 22-Jul-1945  Abigail Evans is a 77 y.o. year old female who sees Elfredia Nevins, MD for primary care. I spoke with  Selinda Orion by phone today.  What matters to the patients health and wellness today?  Managing COPD    Goals Addressed             This Visit's Progress    Manage COPD       Care Coordination Goals: Patient will keep all follow-up PCP and/or pulmonary appointments Patient will call provider with any new or worsening symptoms Patient will track and manage COPD triggers Patient will use medications as directed by PCP or pulmonologist  Patient will engage in light exercise as tolerated 3-5 days a week to aid in the the management of COPD Patient will remain physically active as tolerated and will rest as needed Patient will monitor O2 sats with pulse ox and seek medical attention for readings <93% that do not improve with rest Provided will use infection prevention strategies to reduce risk of respiratory infection Patient will state understanding of the importance of adequate rest and management of fatigue with COPD Patient will continue to use O2 at 2 L per minute PRN per [place] via floor concentrator Patient will call RN Care Coordinator [phone] with any care coordination or resource needs          SDOH assessments and interventions completed:  Yes  SDOH Interventions Today    Flowsheet Row Most Recent Value  SDOH Interventions   Transportation Interventions Intervention Not Indicated  Physical Activity Interventions Other (Comments)  [limited by COPD]        Care Coordination Interventions:  Yes, provided  Interventions Today    Flowsheet Row Most Recent Value  Chronic Disease   Chronic disease during today's visit Chronic Obstructive Pulmonary Disease (COPD)  General Interventions   General Interventions Discussed/Reviewed General Interventions  Reviewed, General Interventions Discussed, Durable Medical Equipment (DME), Doctor Visits  Doctor Visits Discussed/Reviewed Doctor Visits Discussed, Doctor Visits Reviewed, Specialist, PCP  Durable Medical Equipment (DME) Oxygen  PCP/Specialist Visits Compliance with follow-up visit  Exercise Interventions   Exercise Discussed/Reviewed Exercise Discussed, Exercise Reviewed, Physical Activity  Physical Activity Discussed/Reviewed Physical Activity Discussed, Physical Activity Reviewed  Education Interventions   Education Provided Provided Education  Provided Verbal Education On Mental Health/Coping with Illness, When to see the doctor, Exercise, Medication  Nutrition Interventions   Nutrition Discussed/Reviewed Nutrition Discussed, Nutrition Reviewed  Pharmacy Interventions   Pharmacy Dicussed/Reviewed Pharmacy Topics Discussed, Pharmacy Topics Reviewed, Medications and their functions  Safety Interventions   Safety Discussed/Reviewed Safety Discussed, Safety Reviewed, Fall Risk, Home Safety       Follow up plan: Follow up call scheduled for 11/26/22    Encounter Outcome:  Pt. Visit Completed   Demetrios Loll, BSN, RN-BC RN Care Coordinator [facility]  [facility] Direct Dial: [phone] Main #: [phone]

## 2022-11-15 DIAGNOSIS — J449 Chronic obstructive pulmonary disease, unspecified: Secondary | ICD-10-CM | POA: Diagnosis not present

## 2022-11-19 NOTE — Progress Notes (Unsigned)
Abigail Evans, female    DOB: April 28, 1945    MRN: [id number]   Brief patient profile:  24 yowf  quit smoking 2023 / retired from house cleaning 2019  referred to pulmonary clinic in [place]  10/09/2022 by Eden Emms for copd evaluation/ 02 dep since Jun 2021 admit:      Admit date: 10/13/2019 Discharge date: 10/16/2019    Discharge Diagnoses:    GERD (gastroesophageal reflux disease)   COPD exacerbation (HCC)   Atrial tachycardia (HCC)   Acute respiratory failure with hypoxia (HCC)   Acute respiratory failure (HCC)   Abnormal TSH             History of present illness:  As per H&P written by Dr. Randol Kern on 10/13/2019  Abigail Evans  is a 77 y.o. female, COPD, GERD, recent hospitalization May, where she was discharged with home oxygen, but this was discontinued by as she did not require any further oxygen, patient was seen by cardiology recently given sinus tachycardia/SVT during recent hospitalization, no further work-up indicated per cardiology given normal echo and BNP, with abnormal TSH felt contributing to her symptoms. -Bradycardia secondary to complaints of shortness of breath, and wheezing, over last 4 days, as well reports cough, with a productive sputum, the symptoms resembles recent hospitalization, she was seen recently by cardiologist where she was resumed back on her Lopressor, she reports her dyspnea and wheezing preceding the initiation of her metoprolol, she denies hemoptysis, fever, chills, nausea, vomiting, chest pain and dizziness, patient reports she did not receive her COVID-19 vaccine. -In ED she was significantly tachypneic on presentation, she received IV steroids, and continues nebulizer treatment, magnesium sulfate, and despite that she remains hypoxic 85% on room air with activity, tachypneic as well, chest x-ray with no evidence of infection, [facility] hospitalist was consulted to admit.   Hospital Course:  1-acute hypoxic respiratory failure due to COPD  exacerbation and bronchiectasis -Patient 85% on room air especially with ambulation -Good O2 sat maintained on 2 L nasal cannula supplementation. -Improved air movement bilaterally, very little expiratory wheezing at time of discharge and able to speak in full sentences. -Continue with steroids tapering, start symbicort BID and continue daily spiriva. Patient will also continue albuterol rescue bronchodilator management and will complete antibiotics (cefdinir) as instructed. -Patient will benefit of outpatient evaluation by pulmonologist for PFTs and further adjustment to maintenance therapy.     2-GERD (gastroesophageal reflux disease) -Continue PRN PPI -no reflux symptoms reported.   3-abnormal thyroid panel results -Elevated free T4 and normal TSH appreciated currently -Subclinical hyperthyroidism most likely. -Repeat thyroid panel in 4 weeks and if needed will Recommend outpatient follow-up with endocrinology service. -no thyromegaly seen   4-history of SVT -Continue outpatient follow-up with cardiology service. -Currently rate controlled. -Continue metoprolol.     History of Present Illness  10/09/2022  Pulmonary/ 1st office eval/ Abigail Evans / [place] [facility]  Chief Complaint  Patient presents with   Consult  Dyspnea:  since 09/2019 stays at home x to shop cross parking lot and [employer] then has to stop  Cough: more  in am p inhalers > clear mucus Sleep: flat bed 2 pillows or can't breath SABA use: 1-2 puffs per day hfa 02: since 09/2019 but portable is too heavy  Uses 2lpm  Rec Plan A = Automatic = Always=    Symbicort 160 Take 2 puffs first thing in am and then another 2 puffs about 12 hours later.  Work on inhaler technique:  Plan B = Backup (to supplement plan A, not to replace it) Only use your albuterol inhaler as a rescue medication Make sure you check your oxygen saturation  AT  your highest level of activity (not after you stop)   to be sure it stays  over 90% Pantoprazole (protonix) 40 mg   Take  30-60 min before first meal of the day and Pepcid (famotidine)  20 mg after supper until return to office Please schedule a follow up office visit in 6 weeks, call sooner if needed with all medications /inhalers/ solutions in hand    11/20/2022  f/u ov/[place] office/Abigail Evans re: *** maint on ***  No chief complaint on file.   Dyspnea:  *** Cough: *** Sleeping: *** SABA use: *** 02: *** Covid status: *** Lung cancer screening: ***   No obvious day to day or daytime variability or assoc excess/ purulent sputum or mucus plugs or hemoptysis or cp or chest tightness, subjective wheeze or overt sinus or hb symptoms.   *** without nocturnal  or early am exacerbation  of respiratory  c/o's or need for noct saba. Also denies any obvious fluctuation of symptoms with weather or environmental changes or other aggravating or alleviating factors except as outlined above   No unusual exposure hx or h/o childhood pna/ asthma or knowledge of premature birth.  Current Allergies, Complete Past Medical History, Past Surgical History, Family History, and Social History were reviewed in [employer] record.  ROS  The following are not active complaints unless bolded Hoarseness, sore throat, dysphagia, dental problems, itching, sneezing,  nasal congestion or discharge of excess mucus or purulent secretions, ear ache,   fever, chills, sweats, unintended wt loss or wt gain, classically pleuritic or exertional cp,  orthopnea pnd or arm/hand swelling  or leg swelling, presyncope, palpitations, abdominal pain, anorexia, nausea, vomiting, diarrhea  or change in bowel habits or change in bladder habits, change in stools or change in urine, dysuria, hematuria,  rash, arthralgias, visual complaints, headache, numbness, weakness or ataxia or problems with walking or coordination,  change in mood or  memory.        No outpatient medications have been  marked as taking for the 11/20/22 encounter (Appointment) with Nyoka Cowden, MD.            Past Medical History:  Diagnosis Date   Arthritis    COPD (chronic obstructive pulmonary disease) (HCC)    Dysphagia    GERD (gastroesophageal reflux disease)       Objective:     Wt Readings from Last 3 Encounters:  10/09/22 132 lb 6.4 oz (60.1 kg)  09/01/22 133 lb 6.4 oz (60.5 kg)  08/02/21 133 lb (60.3 kg)      Vital signs reviewed  11/20/2022  - Note at rest 02 sats  ***% on ***   General appearance:    ***   Mod barr***            Assessment

## 2022-11-20 ENCOUNTER — Ambulatory Visit: Payer: PPO | Admitting: Internal Medicine

## 2022-11-20 ENCOUNTER — Encounter: Payer: Self-pay | Admitting: Internal Medicine

## 2022-11-20 VITALS — BP 138/75 | HR 69 | Ht 65.0 in | Wt 132.0 lb

## 2022-11-20 DIAGNOSIS — J449 Chronic obstructive pulmonary disease, unspecified: Secondary | ICD-10-CM

## 2022-11-20 DIAGNOSIS — Z87891 Personal history of nicotine dependence: Secondary | ICD-10-CM | POA: Diagnosis not present

## 2022-11-20 DIAGNOSIS — F17210 Nicotine dependence, cigarettes, uncomplicated: Secondary | ICD-10-CM | POA: Insufficient documentation

## 2022-11-20 DIAGNOSIS — J9611 Chronic respiratory failure with hypoxia: Secondary | ICD-10-CM

## 2022-11-20 NOTE — Assessment & Plan Note (Addendum)
10/09/2022   Walked on RA  x 1   lap(s) =  approx 150 ft  @ mod pace, stopped due to desats to 87% placed on 2lpm x 2nd lap  with lowest 02 sats 92%  10/09/2022 rec 2lpm 24/7 > using prn p ex as of 11/20/2022   11/20/2022   Walked on RA   x  3  lap(s) =  approx 450  ft  @ mod pace, stopped due to end of study with lowest 02 sats 90% so rec 02 prn sats < 90% with exertion (NOT p exertion )     Each maintenance medication was reviewed in detail including emphasizing most importantly the difference between maintenance and prns and under what circumstances the prns are to be triggered using an action plan format where appropriate.  Total time for H and P, chart review, counseling, reviewing hfa/pulse ox/02 device(s) , directly observing portions of ambulatory 02 saturation study/ and generating customized AVS unique to this office visit / same day charting > 30 min

## 2022-11-20 NOTE — Assessment & Plan Note (Signed)
Referred for LCS 11/20/2022 >>>   Low-dose CT lung cancer screening is recommended for patients who are 31-77 years of age with a 20+ pack-year history of smoking and who are currently smoking or quit <=15 years ago. No coughing up blood  No unintentional weight loss of > 15 pounds in the last 6 months - pt is eligible for scanning yearly until age 54 > referred

## 2022-11-20 NOTE — Assessment & Plan Note (Signed)
Quit smoking 2023  - 10/09/2022  After extensive coaching inhaler device,  effectiveness =    60% from a baseline of 30% (short Ti)> symbicort 160  - 10/09/22  EOS  0.5 / Alpha one phenotype  MM / level 165 - 11/20/2022  After extensive coaching inhaler device,  effectiveness =    80% so continue symbicort 160 pending pfts with more approp saba   She prefers symbicort 160 over breztri and ok to continue for now but needs to understand how/ when to use saba more approp:  Re SABA :  I spent extra time with pt today reviewing appropriate use of albuterol for prn use on exertion with the following points: 1) saba is for relief of sob that does not improve by walking a slower pace or resting but rather if the pt does not improve after trying this first. 2) If the pt is convinced, as many are, that saba helps recover from activity faster then it's easy to tell if this is the case by re-challenging : ie stop, take the inhaler, then p 5 minutes try the exact same activity (intensity of workload) that just caused the symptoms and see if they are substantially diminished or not after saba 3) if there is an activity that reproducibly causes the symptoms, try the saba 15 min before the activity on alternate days   If in fact the saba really does help, then fine to continue to use it prn but advised may need to look closer at the maintenance regimen being used to achieve better control of airways disease with exertion.

## 2022-11-20 NOTE — Patient Instructions (Addendum)
Make sure you check your oxygen saturation  AT  your highest level of activity (not after you stop)   to be sure it stays over 90% and adjust  02 flow upward to maintain this level if needed but remember to turn it back to previous settings when you stop (to conserve your supply).   Plan A = Automatic = Always=    symbicort 160 Take 2 puffs first thing in am and then another 2 puffs about 12 hours later.    Work on inhaler technique:  relax and gently blow all the way out then take a nice smooth full deep breath back in, triggering the inhaler at same time you start breathing in.  Hold breath in for at least  5 seconds if you can. Blow out symbicort  thru nose. Rinse and gargle with water when done.  If mouth or throat bother you at all,  try brushing teeth/gums/tongue with arm and hammer toothpaste/ make a slurry and gargle and spit out.       Plan B = Backup (to supplement plan A, not to replace it) Only use your albuterol inhaler as a rescue medication to be used if you can't catch your breath by resting or doing a relaxed purse lip breathing pattern.  - The less you use it, the better it will work when you need it. - Ok to use the inhaler up to 2 puffs  every 4 hours if you must but call for appointment if use goes up over your usual need - Don't leave home without it !!  (think of it like the spare tire for your car)   Also  Ok to try albuterol 15 min before an activity (on alternating days)  that you know would usually make you short of breath and see if it makes any difference and if makes none then don't take albuterol after activity unless you can't catch your breath as this means it's the resting that helps, not the albuterol.       My office will be contacting you by phone for referral to lung cancer screening program  - if you don't hear back from my office within one week please call us back or notify us thru MyChart and we'll address it right away.   Please schedule a follow up  visit in 4 months but call sooner if needed PFTs on return

## 2022-11-26 ENCOUNTER — Encounter: Payer: Self-pay | Admitting: *Deleted

## 2022-11-26 ENCOUNTER — Ambulatory Visit: Payer: Self-pay | Admitting: *Deleted

## 2022-11-26 ENCOUNTER — Other Ambulatory Visit: Payer: Self-pay

## 2022-11-26 DIAGNOSIS — J449 Chronic obstructive pulmonary disease, unspecified: Secondary | ICD-10-CM

## 2022-11-26 DIAGNOSIS — Z87891 Personal history of nicotine dependence: Secondary | ICD-10-CM

## 2022-11-26 NOTE — Patient Outreach (Signed)
Care Coordination   Follow Up Visit Note   11/26/2022 Name: Abigail Evans MRN: [id number] DOB: [birth year]  Abigail Evans is a 77 y.o. year old female who sees Elfredia Nevins, MD for primary care. I spoke with  Abigail Evans by phone today.  What matters to the patients health and wellness today?  Managing COPD and scheduling screening CT lungs.    Goals Addressed             This Visit's Progress    Manage COPD       Care Coordination Goals: Patient will keep all follow-up PCP and/or pulmonary appointments Schedule F/U Appt. With pulmonologist in 4 months for PFTs Patient will talk with Pulmonary office Re: scheduling screening CT lungs Patient will call provider with any new or worsening symptoms Patient will track and manage COPD triggers Patient will use medications as directed by PCP or pulmonologist  Patient will refer to Pulmonary After Visit Summary for instructions on inhaler use Patient will engage in light exercise as tolerated 3-5 days a week to aid in the the management of COPD Patient will remain physically active as tolerated and will rest as needed Patient will monitor O2 sats with pulse ox and will use supplemental oxygen at 2L per min if O2 sats are less than 90% Patient will use infection prevention strategies to reduce risk of respiratory infection Patient will refer to COPD Action Zone Handout for management of COPD symptoms Patient will call RN Care Coordinator [phone] with any care coordination or resource needs          SDOH assessments and interventions completed:  Yes  SDOH Interventions Today    Flowsheet Row Most Recent Value  SDOH Interventions   Transportation Interventions Intervention Not Indicated  Financial Strain Interventions Intervention Not Indicated        Care Coordination Interventions:  Yes, provided  Interventions Today    Flowsheet Row Most Recent Value  Chronic Disease   Chronic disease during today's visit  Chronic Obstructive Pulmonary Disease (COPD)  General Interventions   General Interventions Discussed/Reviewed General Interventions Discussed, General Interventions Reviewed, Health Screening, Durable Medical Equipment (DME), Communication with  Health Screening --  [Screening CT lungs to be scheduld by Pulmonary office. PFTs to be done at next visit in Nov 2024]  Durable Medical Equipment (DME) Oxygen, Other  [Pulse Ox. Monitor O2 Sats as needed and use oxygen at 2L per minute for levles below 90%.]  Communication with PCP/Specialists  [[facility] Re: screening CT lungs. Patient is expecting a call and per AVS their office will call her to schedule. I don't see any active orders for a screening CT. Staff message sent to pulmonray clinical pool.]  Exercise Interventions   Exercise Discussed/Reviewed Exercise Reviewed, Exercise Discussed, Physical Activity  [encouraged light exercise 3-5 days per week and rest as needed]  Physical Activity Discussed/Reviewed Physical Activity Discussed, Physical Activity Reviewed  Education Interventions   Education Provided Provided Printed Education, Provided Education  [printed education on COPD Action Zones]  Provided Verbal Education On When to see the doctor, Exercise, Other  [Pulmonary functions tests, screening CT lungs, when to use supplemental oxygen, when to use rescue inhalers]  Nutrition Interventions   Nutrition Discussed/Reviewed Nutrition Discussed, Nutrition Reviewed, Fluid intake, Adding fruits and vegetables, Portion sizes  [balanced diet with fruits, vegetables, lean proteins, and healthy fats]  Pharmacy Interventions   Pharmacy Dicussed/Reviewed Pharmacy Topics Discussed, Pharmacy Topics Reviewed, Medications and their functions  [per pulmologist, does  not need symbicort and Breztri. Receiving Breztri through prescription assistance but feels Symbicort works better. Using [facility] for now and Albuterol inhaler PRN. only using rescue a few  days a week if needed]  Safety Interventions   Safety Discussed/Reviewed Safety Discussed, Safety Reviewed       Follow up plan: Follow up call scheduled for 12/26/22    Encounter Outcome:  Pt. Visit Completed   Abigail Evans, BSN, RN-BC RN Care Coordinator [facility]  [facility] Direct Dial: [phone] Main #: [phone]

## 2022-11-27 ENCOUNTER — Telehealth: Payer: Self-pay | Admitting: Internal Medicine

## 2022-12-01 DIAGNOSIS — J439 Emphysema, unspecified: Secondary | ICD-10-CM | POA: Diagnosis not present

## 2022-12-01 DIAGNOSIS — I10 Essential (primary) hypertension: Secondary | ICD-10-CM | POA: Diagnosis not present

## 2022-12-01 DIAGNOSIS — M810 Age-related osteoporosis without current pathological fracture: Secondary | ICD-10-CM | POA: Diagnosis not present

## 2022-12-01 DIAGNOSIS — Z9981 Dependence on supplemental oxygen: Secondary | ICD-10-CM | POA: Diagnosis not present

## 2022-12-01 DIAGNOSIS — Z9849 Cataract extraction status, unspecified eye: Secondary | ICD-10-CM | POA: Diagnosis not present

## 2022-12-01 DIAGNOSIS — K219 Gastro-esophageal reflux disease without esophagitis: Secondary | ICD-10-CM | POA: Diagnosis not present

## 2022-12-16 DIAGNOSIS — J449 Chronic obstructive pulmonary disease, unspecified: Secondary | ICD-10-CM | POA: Diagnosis not present

## 2022-12-26 ENCOUNTER — Ambulatory Visit: Payer: Self-pay | Admitting: *Deleted

## 2022-12-26 ENCOUNTER — Encounter: Payer: Self-pay | Admitting: *Deleted

## 2022-12-26 NOTE — Patient Outreach (Signed)
  Care Coordination   Follow Up Visit Note   12/26/2022 Name: EMMERSEN HEEKE MRN: [id number] DOB: 07/10/1945  TYJUANA SOTOLONGO is a 77 y.o. year old female who sees Elfredia Nevins, MD for primary care. I spoke with  Selinda Orion by phone today.  What matters to the patients health and wellness today?  Scheduling Pulmonary Function Test    Goals Addressed             This Visit's Progress    Manage COPD       Care Coordination Goals: Patient will keep all follow-up PCP and/or pulmonary appointments Patient will reach out to Respiratory Therapy scheduling department at [phone] if she hasn't received a call by the end of next week to schedule PFT at [facility] Patient will engage in light exercise as tolerated 3-5 days a week to aid in the the management of COPD Patient will remain physically active as tolerated and will rest as needed Patient will call RN Care Coordinator [phone] with any care coordination or resource needs          SDOH assessments and interventions completed:  Yes  SDOH Interventions Today    Flowsheet Row Most Recent Value  SDOH Interventions   Transportation Interventions Intervention Not Indicated  Financial Strain Interventions Intervention Not Indicated  Health Literacy Interventions Intervention Not Indicated        Care Coordination Interventions:  Yes, provided  Interventions Today    Flowsheet Row Most Recent Value  Chronic Disease   Chronic disease during today's visit Chronic Obstructive Pulmonary Disease (COPD)  General Interventions   General Interventions Discussed/Reviewed Communication with  Durable Medical Equipment (DME) Oxygen, Other  [Pulse ox: SPO2 stays above 90% at rest and will drop down into the high 80s with acitivty. Recovers quickly with rest. Has supplemental O2 that she can use at 2L if needed.]  Communication with PCP/Specialists  Zenaida Niece, RT with [facility] in [place] Re: PFT orders that  haven't been scheduled yet. Needs to be done before Appt. with Dr [name] in December. Benna Dunks spoke with scheduling on 11/27/22 and again today. Patient should get a call to setup.]  Exercise Interventions   Exercise Discussed/Reviewed Physical Activity  [limited by COPD but able to perform ADLs]  Physical Activity Discussed/Reviewed Physical Activity Discussed, Physical Activity Reviewed  Education Interventions   Education Provided Provided Education  Provided Verbal Education On Medication, Exercise, When to see the doctor, Other  [Provided with contact number for respiratory therapy [phone] so that she can reach out to them directly if they have not called to schedule PFTs by the end of next week.]  Pharmacy Interventions   Pharmacy Dicussed/Reviewed Pharmacy Topics Discussed, Pharmacy Topics Reviewed, Medications and their functions  [Prescription assistance for inhalers will run out at the end of year. Advised patient that she should get a renewal notice around November to send back in for next year. Using inhalers as recomended.]       Follow up plan: Follow up call scheduled for 01/28/23    Encounter Outcome:  Patient Visit Completed   Demetrios Loll, RN, BSN Care Management Coordinator [facility]  [facility] Direct Dial: [phone] Main #: [phone]

## 2022-12-30 ENCOUNTER — Ambulatory Visit (HOSPITAL_COMMUNITY)
Admission: RE | Admit: 2022-12-30 | Discharge: 2022-12-30 | Disposition: A | Discharge status: Home / Self Care | Payer: PPO | Source: Ambulatory Visit | Attending: Internal Medicine | Admitting: Internal Medicine

## 2022-12-30 DIAGNOSIS — F17210 Nicotine dependence, cigarettes, uncomplicated: Secondary | ICD-10-CM | POA: Diagnosis not present

## 2022-12-30 DIAGNOSIS — J449 Chronic obstructive pulmonary disease, unspecified: Secondary | ICD-10-CM | POA: Diagnosis not present

## 2022-12-30 DIAGNOSIS — Z87891 Personal history of nicotine dependence: Secondary | ICD-10-CM | POA: Insufficient documentation

## 2023-01-12 DIAGNOSIS — Z6823 Body mass index (BMI) 23.0-23.9, adult: Secondary | ICD-10-CM | POA: Diagnosis not present

## 2023-01-12 DIAGNOSIS — M47812 Spondylosis without myelopathy or radiculopathy, cervical region: Secondary | ICD-10-CM | POA: Diagnosis not present

## 2023-01-12 DIAGNOSIS — Z1331 Encounter for screening for depression: Secondary | ICD-10-CM | POA: Diagnosis not present

## 2023-01-12 DIAGNOSIS — J449 Chronic obstructive pulmonary disease, unspecified: Secondary | ICD-10-CM | POA: Diagnosis not present

## 2023-01-12 DIAGNOSIS — M48061 Spinal stenosis, lumbar region without neurogenic claudication: Secondary | ICD-10-CM | POA: Diagnosis not present

## 2023-01-12 DIAGNOSIS — M810 Age-related osteoporosis without current pathological fracture: Secondary | ICD-10-CM | POA: Diagnosis not present

## 2023-01-12 DIAGNOSIS — R413 Other amnesia: Secondary | ICD-10-CM | POA: Diagnosis not present

## 2023-01-12 DIAGNOSIS — G894 Chronic pain syndrome: Secondary | ICD-10-CM | POA: Diagnosis not present

## 2023-01-12 DIAGNOSIS — Z0001 Encounter for general adult medical examination with abnormal findings: Secondary | ICD-10-CM | POA: Diagnosis not present

## 2023-01-12 DIAGNOSIS — K219 Gastro-esophageal reflux disease without esophagitis: Secondary | ICD-10-CM | POA: Diagnosis not present

## 2023-01-14 NOTE — Telephone Encounter (Signed)
Patient scheduled for PFT at [facility] for 11/12

## 2023-01-16 DIAGNOSIS — J449 Chronic obstructive pulmonary disease, unspecified: Secondary | ICD-10-CM | POA: Diagnosis not present

## 2023-01-19 DIAGNOSIS — J449 Chronic obstructive pulmonary disease, unspecified: Secondary | ICD-10-CM | POA: Diagnosis not present

## 2023-01-19 DIAGNOSIS — G894 Chronic pain syndrome: Secondary | ICD-10-CM | POA: Diagnosis not present

## 2023-01-19 DIAGNOSIS — J9611 Chronic respiratory failure with hypoxia: Secondary | ICD-10-CM | POA: Diagnosis not present

## 2023-01-19 DIAGNOSIS — M48061 Spinal stenosis, lumbar region without neurogenic claudication: Secondary | ICD-10-CM | POA: Diagnosis not present

## 2023-01-28 ENCOUNTER — Encounter: Payer: Self-pay | Admitting: *Deleted

## 2023-02-18 ENCOUNTER — Ambulatory Visit: Payer: Self-pay | Admitting: *Deleted

## 2023-02-18 NOTE — Patient Outreach (Signed)
Care Coordination   02/18/2023 Name: Abigail Evans MRN: [id number] DOB: [birth year]   Care Coordination Outreach Attempts:  An unsuccessful telephone outreach was attempted for a scheduled appointment today.  Follow Up Plan:  No further outreach attempts will be made at this time. We have been unable to contact the patient to offer or enroll patient in care coordination services  Patient's Primary Care office is not partnering with the [facility] for care management and will be providing Care Management Services themselves. This final Care Management note will be securely faxed to the PCP office for handoff. Patient has been encouraged to reach out to their PCP office with any resource or care management needs.   Encounter Outcome:  No Answer   Care Coordination Interventions:  No, not indicated    Demetrios Loll, RN, BSN Care Management Coordinator [facility]  [facility] Direct Dial: [phone] Main #: [phone]

## 2023-02-19 DIAGNOSIS — J449 Chronic obstructive pulmonary disease, unspecified: Secondary | ICD-10-CM | POA: Diagnosis not present

## 2023-02-19 DIAGNOSIS — M48061 Spinal stenosis, lumbar region without neurogenic claudication: Secondary | ICD-10-CM | POA: Diagnosis not present

## 2023-03-03 ENCOUNTER — Ambulatory Visit (HOSPITAL_COMMUNITY)
Admission: RE | Admit: 2023-03-03 | Discharge: 2023-03-03 | Disposition: A | Discharge status: Home / Self Care | Payer: PPO | Source: Ambulatory Visit | Attending: Internal Medicine | Admitting: Internal Medicine

## 2023-03-03 DIAGNOSIS — J449 Chronic obstructive pulmonary disease, unspecified: Secondary | ICD-10-CM | POA: Insufficient documentation

## 2023-03-03 LAB — PULMONARY FUNCTION TEST
DL/VA % pred: 57 %
DL/VA: 2.36 ml/min/mmHg/L
DLCO unc % pred: 44 %
DLCO unc: 8.43 ml/min/mmHg
FEF 25-75 Post: 0.2 L/s
FEF 25-75 Pre: 0.2 L/s
FEF2575-%Change-Post: 0 %
FEF2575-%Pred-Post: 12 %
FEF2575-%Pred-Pre: 12 %
FEV1-%Change-Post: -5 %
FEV1-%Pred-Post: 34 %
FEV1-%Pred-Pre: 36 %
FEV1-Post: 0.71 L
FEV1-Pre: 0.75 L
FEV1FVC-%Change-Post: 1 %
FEV1FVC-%Pred-Pre: 39 %
FEV6-%Change-Post: 0 %
FEV6-%Pred-Post: 68 %
FEV6-%Pred-Pre: 68 %
FEV6-Post: 1.77 L
FEV6-Pre: 1.77 L
FEV6FVC-%Change-Post: 6 %
FEV6FVC-%Pred-Post: 78 %
FEV6FVC-%Pred-Pre: 73 %
FVC-%Change-Post: -6 %
FVC-%Pred-Post: 86 %
FVC-%Pred-Pre: 93 %
FVC-Post: 2.38 L
FVC-Pre: 2.55 L
Post FEV1/FVC ratio: 30 %
Post FEV6/FVC ratio: 75 %
Pre FEV1/FVC ratio: 29 %
Pre FEV6/FVC Ratio: 70 %
RV % pred: 150 %
RV: 3.5 L
TLC % pred: 119 %
TLC: 6.06 L

## 2023-03-03 MED ORDER — ALBUTEROL SULFATE (2.5 MG/3ML) 0.083% IN NEBU
2.5000 mg | INHALATION_SOLUTION | Freq: Once | RESPIRATORY_TRACT | Status: AC
  Administered 2023-03-03: 2.5 mg via RESPIRATORY_TRACT

## 2023-03-21 DIAGNOSIS — J449 Chronic obstructive pulmonary disease, unspecified: Secondary | ICD-10-CM | POA: Diagnosis not present

## 2023-03-21 DIAGNOSIS — J9611 Chronic respiratory failure with hypoxia: Secondary | ICD-10-CM | POA: Diagnosis not present

## 2023-03-21 DIAGNOSIS — K219 Gastro-esophageal reflux disease without esophagitis: Secondary | ICD-10-CM | POA: Diagnosis not present

## 2023-03-22 NOTE — Progress Notes (Unsigned)
Abigail Evans, female    DOB: 09-23-1945    MRN: [id number]   Brief patient profile:  10  yowf  quit smoking 2023 / retired from house cleaning 2019  referred to pulmonary clinic in [place]  10/09/2022 by Eden Emms for copd evaluation/ 02 dep since Jun 2021 admit:      Admit date: 10/13/2019 Discharge date: 10/16/2019    Discharge Diagnoses:    GERD (gastroesophageal reflux disease)   COPD exacerbation (HCC)   Atrial tachycardia (HCC)   Acute respiratory failure with hypoxia (HCC)   Acute respiratory failure (HCC)   Abnormal TSH             History of present illness:  As per H&P written by Dr. Randol Kern on 10/13/2019  Abigail Evans  is a 77 y.o. female, COPD, GERD, recent hospitalization May, where she was discharged with home oxygen, but this was discontinued by as she did not require any further oxygen, patient was seen by cardiology recently given sinus tachycardia/SVT during recent hospitalization, no further work-up indicated per cardiology given normal echo and BNP, with abnormal TSH felt contributing to her symptoms. -Bradycardia secondary to complaints of shortness of breath, and wheezing, over last 4 days, as well reports cough, with a productive sputum, the symptoms resembles recent hospitalization, she was seen recently by cardiologist where she was resumed back on her Lopressor, she reports her dyspnea and wheezing preceding the initiation of her metoprolol, she denies hemoptysis, fever, chills, nausea, vomiting, chest pain and dizziness, patient reports she did not receive her COVID-19 vaccine. -In ED she was significantly tachypneic on presentation, she received IV steroids, and continues nebulizer treatment, magnesium sulfate, and despite that she remains hypoxic 85% on room air with activity, tachypneic as well, chest x-ray with no evidence of infection, [facility] hospitalist was consulted to admit.   Hospital Course:  1-acute hypoxic respiratory failure due to COPD  exacerbation and bronchiectasis -Patient 85% on room air especially with ambulation -Good O2 sat maintained on 2 L nasal cannula supplementation. -Improved air movement bilaterally, very little expiratory wheezing at time of discharge and able to speak in full sentences. -Continue with steroids tapering, start symbicort BID and continue daily spiriva. Patient will also continue albuterol rescue bronchodilator management and will complete antibiotics (cefdinir) as instructed. -Patient will benefit of outpatient evaluation by pulmonologist for PFTs and further adjustment to maintenance therapy.     2-GERD (gastroesophageal reflux disease) -Continue PRN PPI -no reflux symptoms reported.   3-abnormal thyroid panel results -Elevated free T4 and normal TSH appreciated currently -Subclinical hyperthyroidism most likely. -Repeat thyroid panel in 4 weeks and if needed will Recommend outpatient follow-up with endocrinology service. -no thyromegaly seen   4-history of SVT -Continue outpatient follow-up with cardiology service. -Currently rate controlled. -Continue metoprolol.     History of Present Illness  10/09/2022  Pulmonary/ 1st office eval/ Abigail Evans / [place] [facility]  Chief Complaint  Patient presents with   Consult  Dyspnea:  since 09/2019 stays at home x to shop cross parking lot and [employer] then has to stop  Cough: more  in am p inhalers > clear mucus Sleep: flat bed 2 pillows or can't breath SABA use: 1-2 puffs per day hfa 02: since 09/2019 but portable is too heavy  Uses 2lpm  Rec Plan A = Automatic = Always=    Symbicort 160 Take 2 puffs first thing in am and then another 2 puffs about 12 hours later.  Work on inhaler  technique:   Plan B = Backup (to supplement plan A, not to replace it) Only use your albuterol inhaler as a rescue medication Make sure you check your oxygen saturation  AT  your highest level of activity (not after you stop)   to be sure it stays  over 90% Pantoprazole (protonix) 40 mg   Take  30-60 min before first meal of the day and Pepcid (famotidine)  20 mg after supper until return to office Please schedule a follow up office visit in 6 weeks, call sooner if needed with all medications /inhalers/ solutions in hand    11/20/2022  f/u ov/[place] office/Abigail Evans re: 02 dep resp failure/GOLD ? Copd  maint on symbicort 160   Chief Complaint  Patient presents with   COPD  Dyspnea:  housework  Cough: none  Sleeping: flat bed 2 pillows under head does fine  SABA use: at most twice daily always p ex  02: prn daytime p ex  Rec     03/23/2023  f/u ov/[place] office/Abigail Evans re: *** maint on ***  No chief complaint on file.   Dyspnea:  *** Cough: *** Sleeping: ***   resp cc  SABA use: *** 02: ***  Lung cancer screening: ***   No obvious day to day or daytime variability or assoc excess/ purulent sputum or mucus plugs or hemoptysis or cp or chest tightness, subjective wheeze or overt sinus or hb symptoms.    Also denies any obvious fluctuation of symptoms with weather or environmental changes or other aggravating or alleviating factors except as outlined above   No unusual exposure hx or h/o childhood pna/ asthma or knowledge of premature birth.  Current Allergies, Complete Past Medical History, Past Surgical History, Family History, and Social History were reviewed in [employer] record.  ROS  The following are not active complaints unless bolded Hoarseness, sore throat, dysphagia, dental problems, itching, sneezing,  nasal congestion or discharge of excess mucus or purulent secretions, ear ache,   fever, chills, sweats, unintended wt loss or wt gain, classically pleuritic or exertional cp,  orthopnea pnd or arm/hand swelling  or leg swelling, presyncope, palpitations, abdominal pain, anorexia, nausea, vomiting, diarrhea  or change in bowel habits or change in bladder habits, change in stools or change in  urine, dysuria, hematuria,  rash, arthralgias, visual complaints, headache, numbness, weakness or ataxia or problems with walking or coordination,  change in mood or  memory.        No outpatient medications have been marked as taking for the 03/23/23 encounter (Appointment) with Nyoka Cowden, MD.             Past Medical History:  Diagnosis Date   Arthritis    COPD (chronic obstructive pulmonary disease) (HCC)    Dysphagia    GERD (gastroesophageal reflux disease)       Objective:    Wts   03/23/2023        ***   11/20/22 132 lb (59.9 kg)  10/09/22 132 lb 6.4 oz (60.1 kg)  09/01/22 133 lb 6.4 oz (60.5 kg)      Vital signs reviewed  03/23/2023  - Note at rest 02 sats  ***% on ***   General appearance:    ***    Mod barr***      Assessment

## 2023-03-23 ENCOUNTER — Encounter: Payer: Self-pay | Admitting: Internal Medicine

## 2023-03-23 ENCOUNTER — Ambulatory Visit: Payer: PPO | Admitting: Internal Medicine

## 2023-03-23 VITALS — BP 144/83 | HR 88 | Ht 65.0 in | Wt 133.0 lb

## 2023-03-23 DIAGNOSIS — F17210 Nicotine dependence, cigarettes, uncomplicated: Secondary | ICD-10-CM

## 2023-03-23 DIAGNOSIS — J9611 Chronic respiratory failure with hypoxia: Secondary | ICD-10-CM

## 2023-03-23 DIAGNOSIS — J449 Chronic obstructive pulmonary disease, unspecified: Secondary | ICD-10-CM

## 2023-03-23 MED ORDER — BUDESONIDE-FORMOTEROL FUMARATE 160-4.5 MCG/ACT IN AERO: INHALATION_SPRAY | RESPIRATORY_TRACT | 12 refills | Status: DC

## 2023-03-23 NOTE — Assessment & Plan Note (Signed)
Active smoker /MM  - 10/09/2022  After extensive coaching inhaler device,  effectiveness =    60% from a baseline of 30% (short Ti)> symbicort 160  - 10/09/22  EOS  0.5 / Alpha one phenotype  MM / level 165 - 11/20/2022  After extensive coaching inhaler device,  effectiveness =    80% so continue symbicort 160 pending pfts with more approp saba  = PFT's  03/03/23  FEV1 0.75 (36 % ) ratio 0.29  p 0 % improvement from saba p Breztri prior to study with FV curve severe classic concavity    - 03/23/2023   Walked on RA  x  3  lap(s) =  approx 450  ft  @ mod pace, stopped due to end of study s sob  with lowest 02 sats 91% > d/c all 02     Group D (now reclassified as E) in terms of symptom/risk and laba/lama/ICS  therefore appropriate rx at this point >>>  breztri though prefers symbicort so will try for generic version and f/u q 6 m

## 2023-03-23 NOTE — Assessment & Plan Note (Signed)
10/09/2022   Walked on RA  x 1   lap(s) =  approx 150 ft  @ mod pace, stopped due to desats to 87% placed on 2lpm x 2nd lap  with lowest 02 sats 92%  10/09/2022 rec 2lpm 24/7 > using prn p ex as of 11/20/2022   11/20/2022   Walked on RA   x  3  lap(s) =  approx 450  ft  @ mod pace, stopped due to end of study with lowest 02 sats 90% so rec 02 prn sats < 90% with exertion (NOT p exertion )  - 03/23/2023 no desats walking mod pace x 450 ft > d/c all 02

## 2023-03-23 NOTE — Patient Instructions (Addendum)
We will discontinue  the oxygen in all forms after checking your levels today  The key is to stop smoking completely before smoking completely stops you!    Please schedule a follow up visit in 6  months but call sooner if needed

## 2023-03-23 NOTE — Assessment & Plan Note (Signed)
Referred for LCS 11/20/2022  - done 12/30/22 RADS 2   Rec keep up with LDS program  Counseled re importance of smoking cessation but did not meet time criteria for separate billing    Each maintenance medication was reviewed in detail including emphasizing most importantly the difference between maintenance and prns and under what circumstances the prns are to be triggered using an action plan format where appropriate.  Total time for H and P, chart review, counseling, reviewing hfa/ 02/pulse ox  device(s) , directly observing portions of ambulatory 02 saturation study/ and generating customized AVS unique to this office visit / same day charting = 30 min

## 2023-04-22 ENCOUNTER — Ambulatory Visit
Admission: EM | Admit: 2023-04-22 | Discharge: 2023-04-22 | Disposition: A | Discharge status: Home / Self Care | Payer: PPO | Attending: Family Medicine | Admitting: Family Medicine

## 2023-04-22 ENCOUNTER — Encounter: Payer: Self-pay | Admitting: Emergency Medicine

## 2023-04-22 DIAGNOSIS — J441 Chronic obstructive pulmonary disease with (acute) exacerbation: Secondary | ICD-10-CM

## 2023-04-22 DIAGNOSIS — J069 Acute upper respiratory infection, unspecified: Secondary | ICD-10-CM

## 2023-04-22 MED ORDER — PREDNISONE 20 MG PO TABS: 40.0000 mg | ORAL_TABLET | Freq: Every day | ORAL | 0 refills | Status: DC

## 2023-04-22 MED ORDER — BENZONATATE 100 MG PO CAPS: 100.0000 mg | ORAL_CAPSULE | Freq: Three times a day (TID) | ORAL | 0 refills | Status: DC
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
## 2023-04-22 NOTE — ED Triage Notes (Signed)
 Sore throat and nasal congestion with cough x 2 days.  States has a headache.

## 2023-04-22 NOTE — Discharge Instructions (Signed)
 In addition to the prescribed medications, continue your inhaler regimen and you may take Flonase nasal spray twice daily, Coricidin HBP as needed, plain Mucinex, use saline sinus rinses, humidifiers, salt water gargles.  Follow-up for worsening symptoms.

## 2023-07-30 ENCOUNTER — Telehealth: Payer: Self-pay | Admitting: Internal Medicine

## 2023-07-30 NOTE — Telephone Encounter (Signed)
 Spoke with patient regarding new appointment time for Monday 09/21/23--10:15 am for Dr. [name] (provider out of the office)----will mail information to patient and she voiced her understanding

## 2023-09-19 DIAGNOSIS — J449 Chronic obstructive pulmonary disease, unspecified: Secondary | ICD-10-CM | POA: Diagnosis not present

## 2023-09-19 DIAGNOSIS — J9611 Chronic respiratory failure with hypoxia: Secondary | ICD-10-CM | POA: Diagnosis not present

## 2023-09-19 DIAGNOSIS — K219 Gastro-esophageal reflux disease without esophagitis: Secondary | ICD-10-CM | POA: Diagnosis not present

## 2023-09-19 NOTE — Progress Notes (Unsigned)
 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 Osborne Evans on 10/13/2019  Abigail Evans  is a 78 y.o. female, COPD, GERD, recent hospitalization May, where she was discharged with home oxygen , but this was discontinued by as she did not require any further oxygen , patient was seen by cardiology recently given sinus tachycardia/SVT during recent hospitalization, no further work-up indicated per cardiology given normal echo and BNP, with abnormal TSH felt contributing to her symptoms. -Bradycardia secondary to complaints of shortness of breath, and wheezing, over last 4 days, as well reports cough, with a productive sputum, the symptoms resembles recent hospitalization, she was seen recently by cardiologist where she was resumed back on her Lopressor , she reports her dyspnea and wheezing preceding the initiation of her metoprolol , she denies hemoptysis, fever, chills, nausea, vomiting, chest pain and dizziness, patient reports she did not receive her COVID-19 vaccine. -In ED she was significantly tachypneic on presentation, she received IV steroids, and continues nebulizer treatment, magnesium  sulfate, and despite that she remains hypoxic 85% on room air with activity, tachypneic as well, chest x-ray with no evidence of infection, [facility] hospitalist was consulted to admit.   Hospital Course:  1-acute hypoxic respiratory failure due to COPD  exacerbation and bronchiectasis -Patient 85% on room air especially with ambulation -Good O2 sat maintained on 2 L nasal cannula supplementation. -Improved air movement bilaterally, very little expiratory wheezing at time of discharge and able to speak in full sentences. -Continue with steroids tapering, start symbicort  BID and continue daily spiriva . Patient will also continue albuterol  rescue bronchodilator management and will complete antibiotics (cefdinir ) as instructed. -Patient will benefit of outpatient evaluation by pulmonologist for PFTs and further adjustment to maintenance therapy.     2-GERD (gastroesophageal reflux disease) -Continue PRN PPI -no reflux symptoms reported.   3-abnormal thyroid  panel results -Elevated free T4 and normal TSH appreciated currently -Subclinical hyperthyroidism most likely. -Repeat thyroid  panel in 4 weeks and if needed will Recommend outpatient follow-up with endocrinology service. -no thyromegaly seen   4-history of SVT -Continue outpatient follow-up with cardiology service. -Currently rate controlled. -Continue metoprolol .     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  Work on inhaler  technique:   Plan B = Backup (to supplement plan A, not to replace it) Only use your albuterol  inhaler as a rescue medication Make sure you check your oxygen  saturation  AT  your highest level of activity (not after you stop)   to be sure it stays  over 90% Pantoprazole  (protonix ) 40 mg   Take  30-60 min before first meal of the day and Pepcid  (famotidine )  20 mg after supper until return to Evans Please schedule a follow up Evans visit in 6 weeks, call sooner if needed with all medications /inhalers/ solutions in hand    11/20/2022  f/u ov/Abigail Evans/Abigail Evans: 02 dep resp failure/GOLD ? Copd  maint on symbicort  160   Chief Complaint  Patient presents with   COPD  Dyspnea:  housework  Cough: none  Sleeping: flat bed 2 pillows under head does fine  SABA use: at most twice daily always p ex  02: prn daytime p ex  Rec Make sure you check your oxygen  saturation  AT  your highest level of activity (not after you stop)   to be sure it stays over 90%   Plan A = Automatic = Always=    symbicort  160 Take 2 puffs first thing in am and then another 2 puffs about 12 hours later.  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   Dyspnea:  *** Cough: *** Sleeping: ***   resp cc  SABA use: *** 02: ***  Lung cancer screening: ***   No obvious day to day or daytime variability or assoc excess/ purulent sputum or  mucus plugs or hemoptysis or cp or chest tightness, subjective wheeze or overt sinus or hb symptoms.    Also denies any obvious fluctuation of symptoms with weather or environmental changes or other aggravating or alleviating factors except as outlined above   No unusual exposure hx or h/o childhood pna/ asthma or knowledge of premature birth.  Current Allergies, Complete Past Medical History, Past Surgical History, Family History, and Social History were reviewed in [employer] record.  ROS  The following are not active complaints unless bolded Hoarseness, sore throat, dysphagia, dental problems, itching, sneezing,  nasal congestion or discharge of excess mucus or purulent secretions, ear ache,   fever, chills, sweats, unintended wt loss or wt gain, classically pleuritic or exertional cp,  orthopnea pnd or arm/hand swelling  or leg swelling, presyncope, palpitations, abdominal pain, anorexia, nausea, vomiting, diarrhea  or  change in bowel habits or change in bladder habits, change in stools or change in urine, dysuria, hematuria,  rash, arthralgias, visual complaints, headache, numbness, weakness or ataxia or problems with walking or coordination,  change in mood or  memory.        No outpatient medications have been marked as taking for the 09/21/23 encounter (Appointment) with Nuel Dejaynes B, MD.              Past Medical History:  Diagnosis Date   Arthritis    COPD (chronic obstructive pulmonary disease) (HCC)    Dysphagia    GERD (gastroesophageal reflux disease)       Objective:    Wts  09/21/2023           ***  03/23/2023        133   11/20/22 132 lb (59.9 kg)  10/09/22 132 lb 6.4 oz (60.1 kg)  09/01/22 133 lb 6.4 oz (60.5 kg)    Vital signs reviewed  09/21/2023  - Note at rest 02 sats  ***% on ***   General appearance:    ***      Mod barr***        Assessment

## 2023-09-21 ENCOUNTER — Encounter: Payer: Self-pay | Admitting: Internal Medicine

## 2023-09-21 ENCOUNTER — Ambulatory Visit: Admitting: Internal Medicine

## 2023-09-21 VITALS — BP 152/83 | HR 92 | Ht <= 58 in | Wt 132.4 lb

## 2023-09-21 DIAGNOSIS — J449 Chronic obstructive pulmonary disease, unspecified: Secondary | ICD-10-CM | POA: Diagnosis not present

## 2023-09-21 DIAGNOSIS — F17210 Nicotine dependence, cigarettes, uncomplicated: Secondary | ICD-10-CM

## 2023-09-21 MED ORDER — FAMOTIDINE 20 MG PO TABS: ORAL_TABLET | ORAL | 11 refills | Status: AC

## 2023-09-21 NOTE — Assessment & Plan Note (Signed)
 Active smoker /MM  - 10/09/2022  After extensive coaching inhaler device,  effectiveness =    60% from a baseline of 30% (short Ti)> symbicort  160  - 10/09/22  EOS  0.5 / Alpha one phenotype  MM / level 165 - 11/20/2022  After extensive coaching inhaler device,  effectiveness =    80% so continue symbicort  160 pending pfts with more approp saba  -  PFT's  03/03/23  FEV1 0.75 (36 % ) ratio 0.29  p 0 % improvement from saba p Breztri prior to study with FV curve severe classic concavity    - 03/23/2023   Walked on RA  x  3  lap(s) =  approx 450  ft  @ mod pace, stopped due to end of study s sob  with lowest 02 sats 91% > d/c all 02   -  09/21/2023  After extensive coaching inhaler device,  effectiveness =    75% (short ti)    Group D (now reclassified as E) in terms of symptom/risk and laba/lama/ICS  therefore appropriate rx at this point >>>  breztri and using saba appropriately

## 2023-09-21 NOTE — Assessment & Plan Note (Signed)
 Referred for [facility] 11/20/2022  - done 12/30/22 RADS 2   Low-dose CT lung cancer screening is recommended for patients who are 17-78 years of age with a 20+ pack-year history of smoking and who are currently smoking or quit <=15 years ago. No coughing up blood  No unintentional weight loss of > 15 pounds in the last 6 months - pt is eligible for scanning yearly until age 4 if insurance will cover     F/u 6 m, sooner if needed     Each maintenance medication was reviewed in detail including emphasizing most importantly the difference between maintenance and prns and under what circumstances the prns are to be triggered using an action plan format where appropriate.  Total time for H and P, chart review, counseling, reviewing hfa  device(s) and generating customized AVS unique to this office visit / same day charting = 32 min

## 2023-09-21 NOTE — Patient Instructions (Addendum)
 My office will be contacting you by phone for referral to lung cancer screening   [phone]) - if you don't hear back from my office within one week,  please call us  back or notify us  thru MyChart and we'll address it right away.    Work on inhaler technique:  relax and gently blow all the way out then take a nice smooth full deep breath back in, triggering the inhaler at same time you start breathing in.  Hold breath in for at least  5 seconds if you can. Blow out [employer] thru nose. Rinse and gargle with water  when done.  If mouth or throat bother you at all,  try brushing teeth/gums/tongue with arm and hammer toothpaste/ make a slurry and gargle and spit out.        The key is to stop smoking completely before smoking completely stops you!  Please schedule a follow up visit in  6 months but call sooner if needed

## 2023-10-27 DIAGNOSIS — H43393 Other vitreous opacities, bilateral: Secondary | ICD-10-CM | POA: Diagnosis not present

## 2023-10-31 ENCOUNTER — Other Ambulatory Visit: Payer: Self-pay | Admitting: Cardiovascular Disease

## 2023-11-02 NOTE — Progress Notes (Unsigned)
 Cardiology Office Note    Date:  11/04/2023  ID:  Abigail Evans, DOB 30-Sep-1945, MRN [id number] Cardiologist: Maude Emmer, MD    History of Present Illness:    Abigail Evans is a 78 y.o. female with past medical history of SVT, sinus tachycardia and COPD who presents to the office today for annual follow-up.   She was last examined by Dr. Emmer in 08/2022 and denied any recent chest pain or palpitations at that time. She was continued on Lopressor  25mg  BID.   In talking with the patient and her niece today, she reports having baseline dyspnea on exertion in the setting of COPD. Feels like symptoms were previously better controlled with Symbicort  but this is not covered well by her insurance and she has been on Breztri. Reports having occasional palpitations but these typically occur in the setting of hypoxia and improve with rest and improvement in her O2 levels. No symptoms otherwise. She denies any chest pain, orthopnea, PND or pitting edema. She has been under increased stress as her sister passed away last month. She is also having to move to a different apartment given an increase in her rent.  Studies Reviewed:   EKG: EKG is ordered today and demonstrates:   EKG Interpretation Date/Time:  Wednesday November 04 2023 08:42:04 EDT Ventricular Rate:  74 PR Interval:  158 QRS Duration:  92 QT Interval:  382 QTC Calculation: 424 R Axis:   -7  Text Interpretation: Normal sinus rhythm Incomplete right bundle branch block Confirmed by Johnson Grate ([id number]) on 11/04/2023 8:44:10 AM       Echocardiogram: 08/2019 IMPRESSIONS     1. Left ventricular ejection fraction, by estimation, is 65 to 70%. The  left ventricle has normal function. The left ventricle has no regional  wall motion abnormalities. There is mild left ventricular hypertrophy.  Left ventricular diastolic parameters  were normal.   2. Right ventricular systolic function is normal. The right ventricular  size is  normal. There is mildly elevated pulmonary artery systolic  pressure.   3. The mitral valve is normal in structure. No evidence of mitral valve  regurgitation. No evidence of mitral stenosis.   4. The aortic valve was not well visualized. Aortic valve regurgitation  is not visualized. No aortic stenosis is present.   5. The inferior vena cava is normal in size with greater than 50%  respiratory variability, suggesting right atrial pressure of 3 mmHg.   Physical Exam:   VS:  BP 136/70 (BP Location: Left Arm, Cuff Size: Normal)   Pulse 74   Ht 5' 5 (1.651 m)   Wt 131 lb 3.2 oz (59.5 kg)   SpO2 96%   BMI 21.83 kg/m    Wt Readings from Last 3 Encounters:  11/04/23 131 lb 3.2 oz (59.5 kg)  09/21/23 132 lb 6.4 oz (60.1 kg)  03/23/23 133 lb (60.3 kg)     GEN: Pleasant female appearing in no acute distress NECK: No JVD; No carotid bruits CARDIAC: RRR, no murmurs, rubs, gallops RESPIRATORY:  Clear to auscultation without rales, wheezing or rhonchi  ABDOMEN: Appears non-distended. No obvious abdominal masses. EXTREMITIES: No clubbing or cyanosis. No pitting edema.  Distal pedal pulses are 2+ bilaterally.   Assessment and Plan:   1. Sinus tachycardia/Palpitations - She reports occasional palpitations which are typically triggered by hypoxia and no other episodes. Encouraged her to make us  aware if symptoms increase in frequency or severity as we could arrange for a 7-day Zio  patch for further assessment. Continue Lopressor  25 mg twice daily for rate-control. - Her niece reports a family history of thyroid  issues and she is unsure if this is contributing. TSH was previously normal when checked last year. She has scheduled follow-up labs with her PCP in the next few months and we will order a repeat TSH at that time.  2.  COPD - Followed by Pulmonology. Remains on Breztri and PRN Albuterol .   Signed, Abigail CHRISTELLA Qua, PA-C

## 2023-11-04 ENCOUNTER — Ambulatory Visit: Attending: Student | Admitting: Student

## 2023-11-04 ENCOUNTER — Encounter: Payer: Self-pay | Admitting: Student

## 2023-11-04 VITALS — BP 136/70 | HR 74 | Ht 65.0 in | Wt 131.2 lb

## 2023-11-04 DIAGNOSIS — J449 Chronic obstructive pulmonary disease, unspecified: Secondary | ICD-10-CM | POA: Diagnosis not present

## 2023-11-04 DIAGNOSIS — R000 Tachycardia, unspecified: Secondary | ICD-10-CM | POA: Diagnosis not present

## 2023-11-04 DIAGNOSIS — R002 Palpitations: Secondary | ICD-10-CM | POA: Diagnosis not present

## 2023-11-04 MED ORDER — METOPROLOL TARTRATE 25 MG PO TABS: ORAL_TABLET | ORAL | 3 refills | Status: AC

## 2023-11-04 NOTE — Patient Instructions (Signed)
 Medication Instructions:   Continue current medication regimen.  *If you need a refill on your cardiac medications before your next appointment, please call your pharmacy*  Lab Work:  TSH with upcoming labs.   If you have labs (blood work) drawn today and your tests are completely normal, you will receive your results only by: MyChart Message (if you have MyChart) OR A paper copy in the mail If you have any lab test that is abnormal or we need to change your treatment, we will call you to review the results.    Follow-Up: At [facility], you and your health needs are our priority.  As part of our continuing mission to provide you with exceptional heart care, our providers are all part of one team.  This team includes your primary Cardiologist (physician) and Advanced Practice Providers or APPs (Physician Assistants and Nurse Practitioners) who all work together to provide you with the care you need, when you need it.  Your next appointment:   1 year(s)  Provider:   You may see Maude Emmer, MD or one of the following Advanced Practice Providers on your designated Care Team:   Laymon Qua, PA-C  [place], [place] Olivia P[place], [place]     We recommend signing up for the patient portal called MyChart.  Sign up information is provided on this After Visit Summary.  MyChart is used to connect with patients for Virtual Visits (Telemedicine).  Patients are able to view lab/test results, encounter notes, upcoming appointments, etc.  Non-urgent messages can be sent to your provider as well.   To learn more about what you can do with MyChart, go to [email or web address].

## 2023-12-29 ENCOUNTER — Telehealth: Payer: Self-pay | Admitting: *Deleted

## 2023-12-29 MED ORDER — BREZTRI AEROSPHERE 160-9-4.8 MCG/ACT IN AERO: 2.0000 | INHALATION_SPRAY | Freq: Two times a day (BID) | RESPIRATORY_TRACT | 3 refills | Status: DC

## 2023-12-29 NOTE — Telephone Encounter (Signed)
 Copied from CRM #[id number]. Topic: General - Other >> Dec 29, 2023  9:54 AM Shona S wrote: Reason for CRM: patient is calling to ask if she can see dr wert next month because her dr who prescribed her inhaler retired so she want dr wert to prescribe it to her.   I called and spoke with the pt  She states her PCP, Dr. Bertell is retiring  [place] Breztri  and is needing new rx sent to [facility]  She states she spoke with PAP rep and that all she needed was new rx sent  I have sent this to [facility] with note to pharmacy that Dr Darlean will be taking this rx over  I advised pt to reach out to us  with any issues  Nothing further needed

## 2024-01-05 ENCOUNTER — Ambulatory Visit
Admission: EM | Admit: 2024-01-05 | Discharge: 2024-01-05 | Disposition: A | Discharge status: Home / Self Care | Attending: Family Medicine | Admitting: Family Medicine

## 2024-01-05 DIAGNOSIS — J069 Acute upper respiratory infection, unspecified: Secondary | ICD-10-CM

## 2024-01-05 DIAGNOSIS — J441 Chronic obstructive pulmonary disease with (acute) exacerbation: Secondary | ICD-10-CM

## 2024-01-05 LAB — POC SOFIA SARS ANTIGEN FIA: SARS Coronavirus 2 Ag: NEGATIVE

## 2024-01-05 MED ORDER — ALBUTEROL SULFATE HFA 108 (90 BASE) MCG/ACT IN AERS: 2.0000 | INHALATION_SPRAY | RESPIRATORY_TRACT | 2 refills | Status: AC | PRN

## 2024-01-05 MED ORDER — BENZONATATE 200 MG PO CAPS: 200.0000 mg | ORAL_CAPSULE | Freq: Three times a day (TID) | ORAL | 0 refills | Status: DC | PRN

## 2024-01-05 MED ORDER — BREZTRI AEROSPHERE 160-9-4.8 MCG/ACT IN AERO: 2.0000 | INHALATION_SPRAY | Freq: Two times a day (BID) | RESPIRATORY_TRACT | 3 refills | Status: AC

## 2024-01-05 MED ORDER — AZITHROMYCIN 250 MG PO TABS: ORAL_TABLET | ORAL | 0 refills | Status: DC

## 2024-01-05 MED ORDER — PREDNISONE 20 MG PO TABS: 40.0000 mg | ORAL_TABLET | Freq: Every day | ORAL | 0 refills | Status: DC

## 2024-01-05 NOTE — Discharge Instructions (Signed)
 COVID test was negative today.  I have sent over refills on both of your inhalers as well as some cough medication, prednisone  and then an antibiotic that you may reserve for if you are worsening over the next few days.  In addition to these medications, you may try Coricidin HBP, plain Mucinex , Flonase , saline sinus rinses

## 2024-01-05 NOTE — ED Triage Notes (Signed)
 Pt reports she has a cough and head congestion x 1 day.
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
## 2024-02-03 ENCOUNTER — Ambulatory Visit
Admission: EM | Admit: 2024-02-03 | Discharge: 2024-02-03 | Disposition: A | Discharge status: Home / Self Care | Attending: Nurse Practitioner | Admitting: Nurse Practitioner

## 2024-02-03 ENCOUNTER — Ambulatory Visit (INDEPENDENT_AMBULATORY_CARE_PROVIDER_SITE_OTHER)

## 2024-02-03 DIAGNOSIS — J441 Chronic obstructive pulmonary disease with (acute) exacerbation: Secondary | ICD-10-CM | POA: Diagnosis not present

## 2024-02-03 DIAGNOSIS — R059 Cough, unspecified: Secondary | ICD-10-CM | POA: Diagnosis not present

## 2024-02-03 DIAGNOSIS — J984 Other disorders of lung: Secondary | ICD-10-CM | POA: Diagnosis not present

## 2024-02-03 DIAGNOSIS — R0602 Shortness of breath: Secondary | ICD-10-CM

## 2024-02-03 MED ORDER — PREDNISONE 20 MG PO TABS: 40.0000 mg | ORAL_TABLET | Freq: Every day | ORAL | 0 refills | Status: AC

## 2024-02-03 MED ORDER — METHYLPREDNISOLONE SODIUM SUCC 125 MG IJ SOLR
80.0000 mg | Freq: Once | INTRAMUSCULAR | Status: AC
  Administered 2024-02-03: 80 mg via INTRAMUSCULAR

## 2024-02-03 MED ORDER — AMOXICILLIN-POT CLAVULANATE 875-125 MG PO TABS: 1.0000 | ORAL_TABLET | Freq: Two times a day (BID) | ORAL | 0 refills | Status: DC

## 2024-02-03 MED ORDER — IPRATROPIUM-ALBUTEROL 0.5-2.5 (3) MG/3ML IN SOLN
3.0000 mL | Freq: Once | RESPIRATORY_TRACT | Status: AC
  Administered 2024-02-03: 3 mL via RESPIRATORY_TRACT

## 2024-02-03 NOTE — ED Triage Notes (Signed)
 Cough, congestion, sob , wheezing x 1 month. Was seen on 9/16 and states she felt better when taking prescribed medications, then the cough came back once she finished her meds.
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
## 2024-02-03 NOTE — Discharge Instructions (Signed)
 The chest x-ray is negative for pneumonia.  Your symptoms are most likely an exacerbation of your COPD. You were given an injection of Solu-Medrol  80 mg and a breathing treatment today.  Start the prednisone  tomorrow. Take medication as prescribed.  Continue use of your daily inhalers as prescribed.  You may also take over-the-counter cough and cold medications such as Robitussin, Coricidin, or Delsym  for your cough. Increase fluids and allow for plenty of rest. You may take over-the-counter Tylenol  as needed for pain, fever, or general discomfort. Recommend the use of a humidifier in your bedroom at nighttime during sleep and sleeping elevated on pillows while symptoms persist. Go to the emergency department if you experience worsening shortness of breath, difficulty breathing, wheezing, or other concerns. Please call your pulmonologist Tuesday to schedule a follow-up appointment to be seen a soon as possible. Follow-up as needed.

## 2024-03-01 ENCOUNTER — Ambulatory Visit: Payer: Self-pay

## 2024-03-01 VITALS — BP 142/78 | HR 80 | Ht 65.0 in | Wt 133.0 lb

## 2024-03-01 DIAGNOSIS — Z7689 Persons encountering health services in other specified circumstances: Secondary | ICD-10-CM | POA: Diagnosis not present

## 2024-03-01 DIAGNOSIS — J449 Chronic obstructive pulmonary disease, unspecified: Secondary | ICD-10-CM | POA: Diagnosis not present

## 2024-03-01 DIAGNOSIS — F03A0 Unspecified dementia, mild, without behavioral disturbance, psychotic disturbance, mood disturbance, and anxiety: Secondary | ICD-10-CM | POA: Diagnosis not present

## 2024-03-01 MED ORDER — DONEPEZIL HCL 5 MG PO TABS: 5.0000 mg | ORAL_TABLET | Freq: Every day | ORAL | 3 refills | Status: AC

## 2024-03-01 NOTE — Progress Notes (Unsigned)
 Established Patient Office Visit  Subjective   Patient ID: Abigail Evans, female    DOB: 04/14/1946  Age: 78 y.o. MRN: [id number]  Chief Complaint  Patient presents with   Establish Care    Pt states needs a refill of her memory medication [employer] retired before she could refill it     HPI Discussed the use of AI scribe software for clinical note transcription with the patient, who gave verbal consent to proceed.  History of Present Illness    Abigail Evans is a 78 year old female with COPD who presents for a follow-up visit and medication refill. She is accompanied by her niece, Abigail.  Chronic obstructive pulmonary disease (copd) and respiratory symptoms - Long-standing COPD managed with inhaler therapy - Uses Breztri  obtained through a patient assistance program; no new prescription required at this time - Increased shortness of breath associated with recent anxiety and life stressors - No influenza vaccination in the past two years  Cervicalgia and musculoskeletal pain - Neck pain radiating from behind the ear across the neck - Pain described as tightness, worsened with head movement, and impairs ability to read comfortably - Moist heat provides relief, but heating pad not used due to concerns about moisture - Family history of arthritis in the neck and hands  Cognitive impairment - Requires refill of Aricept, which provides symptomatic benefit - Unable to see previous physician prior to her retirement  Anxiety and psychosocial stressors - Increased anxiety related to recent significant rent increase and loss of her sister - Anxiety perceived to contribute to shortness of breath     Patient Active Problem List   Diagnosis Date Noted   Mild dementia (HCC) 03/03/2024   Cigarette smoker 11/20/2022   Chronic respiratory failure with hypoxia (HCC) 10/12/2022   COPD GOLD 3 10/09/2022   Acute respiratory failure (HCC) 10/13/2019   Abnormal TSH 10/13/2019   Acute  respiratory failure with hypoxia (HCC) 09/10/2019   Atrial tachycardia 09/08/2019   COPD exacerbation (HCC) 07/23/2017   Osteopenia after menopause 07/23/2017   Tachycardia 07/23/2017   Arthritis 07/23/2017   Hypoxia 07/23/2017   Hemoptysis 08/17/2016   Pneumonia 08/17/2016   GERD (gastroesophageal reflux disease) 12/02/2011      ROS    Objective:     BP (!) 142/78 (BP Location: Left Arm, Patient Position: Sitting, Cuff Size: Normal)   Pulse 80   Ht 5' 5 (1.651 m)   Wt 133 lb (60.3 kg)   SpO2 94%   BMI 22.13 kg/m    Physical Exam Vitals and nursing note reviewed.  Constitutional:      Appearance: Normal appearance.  HENT:     Head: Normocephalic.  Eyes:     Extraocular Movements: Extraocular movements intact.     Pupils: Pupils are equal, round, and reactive to light.  Cardiovascular:     Rate and Rhythm: Normal rate and regular rhythm.  Pulmonary:     Effort: Pulmonary effort is normal.     Breath sounds: Normal breath sounds.  Musculoskeletal:     Cervical back: Normal range of motion and neck supple.  Neurological:     Mental Status: She is alert and oriented to person, place, and time.  Psychiatric:        Mood and Affect: Mood normal.        Thought Content: Thought content normal.      No results found for any visits on 03/01/24.    The ASCVD Risk score (Arnett  DK, et al., 2019) failed to calculate for the following reasons:   Cannot find a previous HDL lab   Cannot find a previous total cholesterol lab    Assessment & Plan:   Problem List Items Addressed This Visit       Nervous and Auditory   Mild dementia (HCC) - Primary   Mild dementia managed effectively with Aricept. - Continue Aricept as prescribed.      Relevant Medications   donepezil (ARICEPT) 5 MG tablet   Other Visit Diagnoses       Encounter to establish care          Return in about 6 months (around 08/29/2024) for chronic follow-up with PCP.    Abigail Longs,  FNP

## 2024-03-03 ENCOUNTER — Telehealth: Payer: Self-pay

## 2024-03-03 DIAGNOSIS — F03A0 Unspecified dementia, mild, without behavioral disturbance, psychotic disturbance, mood disturbance, and anxiety: Secondary | ICD-10-CM | POA: Insufficient documentation

## 2024-03-03 NOTE — Telephone Encounter (Signed)
 Copied from CRM #[id number]. Topic: Clinical - Medication Question >> Mar 03, 2024 12:36 PM Tobias CROME wrote: Reason for CRM: Patient states Abigail Evans was going to send something in for her pain in her neck. Patient states she went to the pharmacy and they have not received a prescription.   Patient inquiring if Abigail Evans is going to send in the prescription

## 2024-03-03 NOTE — Assessment & Plan Note (Signed)
 Mild dementia managed effectively with Aricept. - Continue Aricept as prescribed.

## 2024-03-04 ENCOUNTER — Other Ambulatory Visit: Payer: Self-pay

## 2024-03-04 DIAGNOSIS — M542 Cervicalgia: Secondary | ICD-10-CM

## 2024-03-04 MED ORDER — METHOCARBAMOL 750 MG PO TABS: 750.0000 mg | ORAL_TABLET | Freq: Four times a day (QID) | ORAL | 2 refills | Status: AC | PRN

## 2024-03-04 NOTE — Telephone Encounter (Signed)
 I sent in medication for her neck pain

## 2024-03-21 ENCOUNTER — Ambulatory Visit: Admitting: Internal Medicine

## 2024-03-21 ENCOUNTER — Encounter: Payer: Self-pay | Admitting: Internal Medicine

## 2024-03-21 VITALS — BP 129/58 | HR 75 | Ht 65.0 in | Wt 133.0 lb

## 2024-03-21 DIAGNOSIS — J449 Chronic obstructive pulmonary disease, unspecified: Secondary | ICD-10-CM | POA: Diagnosis not present

## 2024-03-21 NOTE — Patient Instructions (Signed)
 Work on inhaler technique:  relax and gently blow all the way out then take a nice smooth full deep breath back in, triggering the inhaler at same time you start breathing in.  Hold breath in for at least  5 seconds if you can. Blow out Breztri   thru nose. Rinse and gargle with water  when done.  If mouth or throat bother you at all,  try brushing teeth/gums/tongue with arm and hammer toothpaste/ make a slurry and gargle and spit out.   Congratulations on stopping smoking   Please schedule a follow up visit in 6  months but call sooner if needed

## 2024-03-21 NOTE — Assessment & Plan Note (Addendum)
 Quit smoking fall 2025  /MM  - 10/09/2022  After extensive coaching inhaler device,  effectiveness =    60% from a baseline of 30% (short Ti)> symbicort  160  - 10/09/22  EOS  0.5 / Alpha one phenotype  MM / level 165 - 11/20/2022  After extensive coaching inhaler device,  effectiveness =    80% so continue symbicort  160 pending pfts with more approp saba  -  PFT's  03/03/23  FEV1 0.75 (36 % ) ratio 0.29  p 0 % improvement from saba p Breztri  prior to study with FV curve severe classic concavity    - 03/23/2023   Walked on RA  x  3  lap(s) =  approx 450  ft  @ mod pace, stopped due to end of study s sob  with lowest 02 sats 91% > d/c all 02    - 03/21/2024  After extensive coaching inhaler device,  effectiveness =    75% hfa > continue  Breztri 

## 2024-03-21 NOTE — Progress Notes (Signed)
 Abigail Evans, female    DOB: May 08, 1945    MRN: [id number]   Brief patient profile:  62  yowf  active smoker / semi retired from house cleaning 2019  referred to pulmonary clinic in [place]  10/09/2022 by Delford for copd evaluation/ 02 dep since Jun 2021 admit:      Admit date: 10/13/2019 Discharge date: 10/16/2019    Discharge Diagnoses:    GERD (gastroesophageal reflux disease)   COPD exacerbation (HCC)   Atrial tachycardia (HCC)   Acute respiratory failure with hypoxia (HCC)   Acute respiratory failure (HCC)   Abnormal TSH             History of present illness:  As per H&P written by Dr. Sherlon on 10/13/2019  Abigail Evans  is a 78 y.o. female, COPD, GERD, recent hospitalization May, where she was discharged with home oxygen , but this was discontinued by as she did not require any further oxygen , patient was seen by cardiology recently given sinus tachycardia/SVT during recent hospitalization, no further work-up indicated per cardiology given normal echo and BNP, with abnormal TSH felt contributing to her symptoms. -Bradycardia secondary to complaints of shortness of breath, and wheezing, over last 4 days, as well reports cough, with a productive sputum, the symptoms resembles recent hospitalization, she was seen recently by cardiologist where she was resumed back on her Lopressor , she reports her dyspnea and wheezing preceding the initiation of her metoprolol , she denies hemoptysis, fever, chills, nausea, vomiting, chest pain and dizziness, patient reports she did not receive her COVID-19 vaccine. -In ED she was significantly tachypneic on presentation, she received IV steroids, and continues nebulizer treatment, magnesium  sulfate, and despite that she remains hypoxic 85% on room air with activity, tachypneic as well, chest x-ray with no evidence of infection, [facility] hospitalist was consulted to admit.   Hospital Course:  1-acute hypoxic respiratory failure due to COPD  exacerbation and bronchiectasis -Patient 85% on room air especially with ambulation -Good O2 sat maintained on 2 L nasal cannula supplementation. -Improved air movement bilaterally, very little expiratory wheezing at time of discharge and able to speak in full sentences. -Continue with steroids tapering, start symbicort  BID and continue daily spiriva . Patient will also continue albuterol  rescue bronchodilator management and will complete antibiotics (cefdinir ) as instructed. -Patient will benefit of outpatient evaluation by pulmonologist for PFTs and further adjustment to maintenance therapy.     2-GERD (gastroesophageal reflux disease) -Continue PRN PPI -no reflux symptoms reported.   3-abnormal thyroid  panel results -Elevated free T4 and normal TSH appreciated currently -Subclinical hyperthyroidism most likely. -Repeat thyroid  panel in 4 weeks and if needed will Recommend outpatient follow-up with endocrinology service. -no thyromegaly seen   4-history of SVT -Continue outpatient follow-up with cardiology service. -Currently rate controlled. -Continue metoprolol .     History of Present Illness  10/09/2022  Pulmonary/ 1st office eval/ Mardell Cragg / [place] [facility]  Chief Complaint  Patient presents with   Consult  Dyspnea:  since 09/2019 stays at home x to shop cross parking lot and [employer] then has to stop  Cough: more  in am p inhalers > clear mucus Sleep: flat bed 2 pillows or can't breath SABA use: 1-2 puffs per day hfa 02: since 09/2019 but portable is too heavy  Uses 2lpm  Rec Plan A = Automatic = Always=    Symbicort  160 Take 2 puffs first thing in am and then another 2 puffs about 12 hours later.  Work on inhaler  technique:   Plan B = Backup (to supplement plan A, not to replace it) Only use your albuterol  inhaler as a rescue medication Make sure you check your oxygen  saturation  AT  your highest level of activity (not after you stop)   to be sure it stays  over 90% Pantoprazole  (protonix ) 40 mg   Take  30-60 min before first meal of the day and Pepcid  (famotidine )  20 mg after supper until return to office Please schedule a follow up office visit in 6 weeks, call sooner if needed with all medications /inhalers/ solutions in hand    11/20/2022  f/u ov/[place] office/Marisela Line re: 02 dep resp failure/GOLD ? Copd  maint on symbicort  160   Chief Complaint  Patient presents with   COPD  Dyspnea:  housework  Cough: none  Sleeping: flat bed 2 pillows under head does fine  SABA use: at most twice daily always p ex  02: prn daytime p ex  Rec Make sure you check your oxygen  saturation  AT  your highest level of activity (not after you stop)   to be sure it stays over 90%   Plan A = Automatic = Always=    symbicort  160 Take 2 puffs first thing in am and then another 2 puffs about 12 hours later.  Work on inhaler technique:  Plan B = Backup (to supplement plan A, not to replace it) Only use your albuterol  inhaler as a rescue medication Also  Ok to try albuterol  15 min before an activity (on alternating days)  that you know would usually make you short of breath      -  PFT's  03/03/23  FEV1 0.75 (36 % ) ratio 0.29  p 0 % improvement from saba p Breztri  prior to study with FV curve severe classic concavity        09/21/2023  f/u ov/[place] office/Larence Thone re:  COPD  GOLD 3 maint on breztri    Chief Complaint  Patient presents with   COPD   Dyspnea:  still cleaning her house and one other/ still doing [employer] no HC paring  Cough: none  Sleeping: flat bed 2 pillows s    resp cc  SABA use: none  02: none  lung cancer screening: due q   September  Rec  Work on inhaler technique: The key is to stop smoking completely before smoking completely stops you! Please schedule a follow up visit in  6 months but call sooner if needed   LDSCT 9/10/225  RADS 2  emphysema   03/21/2024  f/u ov/[place] office/Betta Balla re: GOLD 3  maint on breztri  /  [facility]  sep/oct 2025 abx and steroids but says she has stopped smoking since Chief Complaint  Patient presents with   COPD    Cold air hurts to breath - doe   Dyspnea:  [employer] slow pace/ now has HC parking but not using it  Cough: none now  Sleeping: flat bed 2 pillows s noct  resp cc  SABA use: once or twice per day , never prechallenges  02: none    No obvious day to day or daytime variability or assoc excess/ purulent sputum or mucus plugs or hemoptysis or cp or chest tightness, subjective wheeze or overt sinus or hb symptoms.    Also denies any obvious fluctuation of symptoms with weather or environmental changes or other aggravating or alleviating factors except as outlined above   No unusual exposure hx or h/o childhood pna/  asthma or knowledge of premature birth.  Current Allergies, Complete Past Medical History, Past Surgical History, Family History, and Social History were reviewed in [employer] record.  ROS  The following are not active complaints unless bolded Hoarseness, sore throat, dysphagia, dental problems, itching, sneezing,  nasal congestion or discharge of excess mucus or purulent secretions, ear ache,   fever, chills, sweats, unintended wt loss or wt gain, classically pleuritic or exertional cp,  orthopnea pnd or arm/hand swelling  or leg swelling, presyncope, palpitations, abdominal pain, anorexia, nausea, vomiting, diarrhea  or change in bowel habits or change in bladder habits, change in stools or change in urine, dysuria, hematuria,  rash, arthralgias, visual complaints, headache, numbness, weakness or ataxia or problems with walking or coordination,  change in mood or  memory.        Current Meds  Medication Sig   albuterol  (VENTOLIN  HFA) 108 (90 Base) MCG/ACT inhaler Inhale 2 puffs into the lungs every 4 (four) hours as needed for wheezing or shortness of breath. 1 puff every 4 to 6 hours as nedded   BREZTRI  AEROSPHERE 160-9-4.8 MCG/ACT AERO  inhaler Inhale 2 puffs into the lungs 2 (two) times daily.   cholecalciferol  (VITAMIN D ) 25 MCG (1000 UNIT) tablet Take 1,000 Units by mouth daily.   donepezil  (ARICEPT ) 5 MG tablet Take 1 tablet (5 mg total) by mouth at bedtime.   famotidine  (PEPCID ) 20 MG tablet One after supper   methocarbamol  (ROBAXIN ) 750 MG tablet Take 1 tablet (750 mg total) by mouth every 6 (six) hours as needed for muscle spasms.   metoprolol  tartrate (LOPRESSOR ) 25 MG tablet TAKE (1) TABLET BY MOUTH TWICE DAILY.              Past Medical History:  Diagnosis Date   Arthritis    COPD (chronic obstructive pulmonary disease) (HCC)    Dysphagia    GERD (gastroesophageal reflux disease)       Objective:    Wts  03/21/2024        133   09/21/2023          132  03/23/2023        133   11/20/22 132 lb (59.9 kg)  10/09/22 132 lb 6.4 oz (60.1 kg)  09/01/22 133 lb 6.4 oz (60.5 kg)    Vital signs reviewed  03/21/2024  - Note at rest 02 sats  95% on RA    General appearance:    thin amb wf nad   HEENT :  Oropharynx  clear   Nasal turbinates nl    NECK :  without JVD/Nodes/TM/ nl carotid upstrokes bilaterally   LUNGS: no acc muscle use,  Mod barrel  contour chest wall with bilateral  Distant bs s audible wheeze and  without cough on insp or exp maneuvers and mod  Hyperresonant  to  percussion bilaterally     CV:  RRR  no s3 or murmur or increase in P2, and no edema   ABD:  soft and nontender with pos mid insp Hoover's  in the supine position. No bruits or organomegaly appreciated, bowel sounds nl  MS:   Ext warm without deformities or   obvious joint restrictions , calf tenderness, cyanosis or clubbing  SKIN: warm and dry without lesions    NEURO:  alert, approp, nl sensorium with  no motor or cerebellar deficits apparent.          I personally reviewed images and agree with radiology impression as  follows:  CXR:   pa and lateral  02/03/24 Hyperexpansion without acute cardiopulmonary findings.  +  Assessment   Assessment & Plan COPD GOLD 3 Quit smoking fall 2025  /MM  - 10/09/2022  After extensive coaching inhaler device,  effectiveness =    60% from a baseline of 30% (short Ti)> symbicort  160  - 10/09/22  EOS  0.5 / Alpha one phenotype  MM / level 165 - 11/20/2022  After extensive coaching inhaler device,  effectiveness =    80% so continue symbicort  160 pending pfts with more approp saba  -  PFT's  03/03/23  FEV1 0.75 (36 % ) ratio 0.29  p 0 % improvement from saba p Breztri  prior to study with FV curve severe classic concavity    - 03/23/2023   Walked on RA  x  3  lap(s) =  approx 450  ft  @ mod pace, stopped due to end of study s sob  with lowest 02 sats 91% > d/c all 02    - 03/21/2024  After extensive coaching inhaler device,  effectiveness =    75% hfa > continue  Breztri      AVS  Patient Instructions  Work on inhaler technique:  relax and gently blow all the way out then take a nice smooth full deep breath back in, triggering the inhaler at same time you start breathing in.  Hold breath in for at least  5 seconds if you can. Blow out Breztri   thru nose. Rinse and gargle with water  when done.  If mouth or throat bother you at all,  try brushing teeth/gums/tongue with arm and hammer toothpaste/ make a slurry and gargle and spit out.   Congratulations on stopping smoking   Please schedule a follow up visit in 6  months but call sooner if needed        Ozell America, MD 03/21/2024

## 2024-08-31 ENCOUNTER — Ambulatory Visit
